# Patient Record
Sex: Female | Born: 1995 | Race: Black or African American | Hispanic: No | Marital: Single | Smoking: Current every day smoker
Health system: Southern US, Community
[De-identification: ages and names within clinical notes are randomized; demographics above are authoritative.]

## PROBLEM LIST (undated history)

## (undated) DIAGNOSIS — F329 Major depressive disorder, single episode, unspecified: Secondary | ICD-10-CM

## (undated) DIAGNOSIS — R519 Headache, unspecified: Secondary | ICD-10-CM

## (undated) DIAGNOSIS — R51 Headache: Secondary | ICD-10-CM

## (undated) DIAGNOSIS — F32A Depression, unspecified: Secondary | ICD-10-CM

---
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
## 2017-05-30 ENCOUNTER — Inpatient Hospital Stay (HOSPITAL_COMMUNITY)
Admission: AD | Admit: 2017-05-30 | Discharge: 2017-05-31 | Disposition: A | Discharge status: Home / Self Care | Payer: Self-pay | Source: Ambulatory Visit | Attending: Obstetrics and Gynecology | Admitting: Obstetrics and Gynecology

## 2017-05-30 ENCOUNTER — Encounter (HOSPITAL_COMMUNITY): Payer: Self-pay

## 2017-05-30 DIAGNOSIS — F17200 Nicotine dependence, unspecified, uncomplicated: Secondary | ICD-10-CM | POA: Insufficient documentation

## 2017-05-30 DIAGNOSIS — K2100 Gastro-esophageal reflux disease with esophagitis, without bleeding: Secondary | ICD-10-CM

## 2017-05-30 DIAGNOSIS — R51 Headache: Secondary | ICD-10-CM | POA: Insufficient documentation

## 2017-05-30 DIAGNOSIS — R079 Chest pain, unspecified: Secondary | ICD-10-CM | POA: Insufficient documentation

## 2017-05-30 DIAGNOSIS — R630 Anorexia: Secondary | ICD-10-CM | POA: Insufficient documentation

## 2017-05-30 DIAGNOSIS — R519 Headache, unspecified: Secondary | ICD-10-CM

## 2017-05-30 HISTORY — DX: Depression, unspecified: F32.A

## 2017-05-30 HISTORY — DX: Major depressive disorder, single episode, unspecified: F32.9

## 2017-05-30 HISTORY — DX: Headache, unspecified: R51.9

## 2017-05-30 HISTORY — DX: Headache: R51

## 2017-05-30 LAB — URINALYSIS, ROUTINE W REFLEX MICROSCOPIC
BILIRUBIN URINE: NEGATIVE
GLUCOSE, UA: NEGATIVE mg/dL
KETONES UR: NEGATIVE mg/dL
NITRITE: NEGATIVE
Protein, ur: 30 mg/dL — AB
Specific Gravity, Urine: 1.027 (ref 1.005–1.030)
pH: 6 (ref 5.0–8.0)

## 2017-05-30 LAB — POCT PREGNANCY, URINE: Preg Test, Ur: NEGATIVE

## 2017-05-30 MED ORDER — FAMOTIDINE 20 MG PO TABS
40.0000 mg | ORAL_TABLET | Freq: Once | ORAL | Status: AC
  Administered 2017-05-31: 40 mg via ORAL
  Filled 2017-05-30: qty 2

## 2017-05-30 MED ORDER — BUTALBITAL-APAP-CAFFEINE 50-325-40 MG PO TABS
2.0000 | ORAL_TABLET | Freq: Once | ORAL | Status: AC
  Administered 2017-05-31: 2 via ORAL
  Filled 2017-05-30: qty 2

## 2017-05-30 NOTE — MAU Note (Signed)
Pt here with c/o chest pain that started a couple of days ago, sharp pains. Denies any vaginal bleeding or discharge. LMP 04/12/17

## 2017-05-30 NOTE — MAU Provider Note (Signed)
Chief Complaint: Chest Pain   None     SUBJECTIVE HPI: Brandi Lambert is a 21 y.o. G0P0000 at Unknown by LMP who presents to maternity admissions reporting intermittent chest pain, h/a, decreased appetite x 1-2 months, worsening today. She reports she has the chest pain after smoking, and sometimes after eating. When she lies down at night she feels nauseous and like something is in her throat so she doesn't sleep much.  She denies chest pain now in MAU, it resolved spontaneously.  She reports h/a most days, constant, frontal, not associated with n/v or photophobia.  She doesn't take anything for them.  She has not tried any treatments for her symptoms. She reports lack of appetite and recent weight loss and wonders if this is causing her h/a.   She denies vaginal bleeding, vaginal itching/burning, urinary symptoms, dizziness, n/v, or fever/chills.     HPI  Past Medical History:  Diagnosis Date  . Depression   . Headache    History reviewed. No pertinent surgical history. Social History   Socioeconomic History  . Marital status: Single    Spouse name: Not on file  . Number of children: Not on file  . Years of education: Not on file  . Highest education level: Not on file  Social Needs  . Financial resource strain: Not on file  . Food insecurity - worry: Not on file  . Food insecurity - inability: Not on file  . Transportation needs - medical: Not on file  . Transportation needs - non-medical: Not on file  Occupational History  . Not on file  Tobacco Use  . Smoking status: Current Every Day Smoker  . Smokeless tobacco: Never Used  Substance and Sexual Activity  . Alcohol use: Yes    Comment: last use Dec 1   . Drug use: Yes    Types: Marijuana    Comment: last usetoday 3 dec  . Sexual activity: Yes    Birth control/protection: None    Comment: last sex 2 days ago  Other Topics Concern  . Not on file  Social History Narrative  . Not on file   No current  facility-administered medications on file prior to encounter.    No current outpatient medications on file prior to encounter.   No Known Allergies  ROS:  Review of Systems  Constitutional: Negative for chills, fatigue and fever.  Respiratory: Negative for shortness of breath.   Cardiovascular: Negative for chest pain.  Gastrointestinal: Negative for abdominal pain, nausea and vomiting.  Genitourinary: Negative for difficulty urinating, dysuria, flank pain, pelvic pain, vaginal bleeding, vaginal discharge and vaginal pain.  Neurological: Positive for headaches. Negative for dizziness.  Psychiatric/Behavioral: Negative.      I have reviewed patient's Past Medical Hx, Surgical Hx, Family Hx, Social Hx, medications and allergies.   Physical Exam   Patient Vitals for the past 24 hrs:  BP Temp Temp src Pulse Resp SpO2 Height Weight  05/31/17 0053 120/81 - - 87 16 - - -  05/30/17 2227 125/78 98.7 F (37.1 C) Oral 85 18 100 % 6' (1.829 m) 147 lb (66.7 kg)   Constitutional: Well-developed, well-nourished female in no acute distress.  Cardiovascular: normal rate Respiratory: normal effort GI: Abd soft, non-tender. Pos BS x 4 MS: Extremities nontender, no edema, normal ROM Neurologic: Alert and oriented x 4.  GU: Neg CVAT.  PELVIC EXAM: Deferred   LAB RESULTS Results for orders placed or performed during the hospital encounter of 05/30/17 (from  the past 24 hour(s))  Urinalysis, Routine w reflex microscopic     Status: Abnormal   Collection Time: 05/30/17 10:25 PM  Result Value Ref Range   Color, Urine YELLOW YELLOW   APPearance HAZY (A) CLEAR   Specific Gravity, Urine 1.027 1.005 - 1.030   pH 6.0 5.0 - 8.0   Glucose, UA NEGATIVE NEGATIVE mg/dL   Hgb urine dipstick SMALL (A) NEGATIVE   Bilirubin Urine NEGATIVE NEGATIVE   Ketones, ur NEGATIVE NEGATIVE mg/dL   Protein, ur 30 (A) NEGATIVE mg/dL   Nitrite NEGATIVE NEGATIVE   Leukocytes, UA TRACE (A) NEGATIVE   RBC / HPF 6-30 0  - 5 RBC/hpf   WBC, UA 0-5 0 - 5 WBC/hpf   Bacteria, UA RARE (A) NONE SEEN   Squamous Epithelial / LPF 6-30 (A) NONE SEEN   Mucus PRESENT   Pregnancy, urine POC     Status: None   Collection Time: 05/30/17 10:40 PM  Result Value Ref Range   Preg Test, Ur NEGATIVE NEGATIVE  CBC     Status: None   Collection Time: 05/30/17 11:59 PM  Result Value Ref Range   WBC 9.9 4.0 - 10.5 K/uL   RBC 4.22 3.87 - 5.11 MIL/uL   Hemoglobin 13.3 12.0 - 15.0 g/dL   HCT 69.6 29.5 - 28.4 %   MCV 97.4 78.0 - 100.0 fL   MCH 31.5 26.0 - 34.0 pg   MCHC 32.4 30.0 - 36.0 g/dL   RDW 13.2 44.0 - 10.2 %   Platelets 249 150 - 400 K/uL  Comprehensive metabolic panel     Status: Abnormal   Collection Time: 05/30/17 11:59 PM  Result Value Ref Range   Sodium 138 135 - 145 mmol/L   Potassium 3.9 3.5 - 5.1 mmol/L   Chloride 104 101 - 111 mmol/L   CO2 28 22 - 32 mmol/L   Glucose, Bld 94 65 - 99 mg/dL   BUN 11 6 - 20 mg/dL   Creatinine, Ser 7.25 0.44 - 1.00 mg/dL   Calcium 9.4 8.9 - 36.6 mg/dL   Total Protein 8.0 6.5 - 8.1 g/dL   Albumin 4.4 3.5 - 5.0 g/dL   AST 17 15 - 41 U/L   ALT 10 (L) 14 - 54 U/L   Alkaline Phosphatase 69 38 - 126 U/L   Total Bilirubin 0.5 0.3 - 1.2 mg/dL   GFR calc non Af Amer >60 >60 mL/min   GFR calc Af Amer >60 >60 mL/min   Anion gap 6 5 - 15  TSH     Status: None   Collection Time: 05/30/17 11:59 PM  Result Value Ref Range   TSH 1.644 0.350 - 4.500 uIU/mL       IMAGING No results found.  MAU Management/MDM: Ordered labs and reviewed results.  CBC, CMP, TSH, UA, all wnl. Chest symptoms c/w GERD. Will treat h/a tonight with Fioricet x 2 tabs and Pepcid 40 mg PO x 1.  Pt reports improved symptoms. D/C home with Rx for both.  Pt to f/u with primary care, given list of providers.Pt discharged with strict return precautions.  ASSESSMENT 1. Frontal headache   2. Decreased appetite   3. Gastroesophageal reflux disease with esophagitis     PLAN Discharge home Allergies as of  05/31/2017   No Known Allergies     Medication List    STOP taking these medications   acyclovir 400 MG tablet Commonly known as:  ZOVIRAX   fluconazole 150 MG tablet  Commonly known as:  DIFLUCAN   sulfamethoxazole-trimethoprim 800-160 MG tablet Commonly known as:  SEPTRA DS     TAKE these medications   butalbital-acetaminophen-caffeine 50-325-40 MG tablet Commonly known as:  FIORICET, ESGIC Take 1-2 tablets by mouth every 6 (six) hours as needed for headache.   ranitidine 150 MG tablet Commonly known as:  ZANTAC Take 1 tablet (150 mg total) by mouth 2 (two) times daily.      Follow-up Information    Dossie ArbourJennings, Jessica, MD Follow up.   Specialty:  Pediatrics Contact information: [address] [facility] [place] [id number] [phone]        [facility] Follow up.   Contact information: [address] [place] [id number] [phone]       [facility] Follow up.   Contact information: [address] [place] [phone]          Sharen CounterLisa Leftwich-Kirby Certified Nurse-Midwife 05/31/2017  1:46 AM

## 2017-05-31 DIAGNOSIS — R51 Headache: Secondary | ICD-10-CM

## 2017-05-31 LAB — CBC
HEMATOCRIT: 41.1 % (ref 36.0–46.0)
Hemoglobin: 13.3 g/dL (ref 12.0–15.0)
MCH: 31.5 pg (ref 26.0–34.0)
MCHC: 32.4 g/dL (ref 30.0–36.0)
MCV: 97.4 fL (ref 78.0–100.0)
Platelets: 249 10*3/uL (ref 150–400)
RBC: 4.22 MIL/uL (ref 3.87–5.11)
RDW: 13.8 % (ref 11.5–15.5)
WBC: 9.9 10*3/uL (ref 4.0–10.5)

## 2017-05-31 LAB — COMPREHENSIVE METABOLIC PANEL
ALK PHOS: 69 U/L (ref 38–126)
ALT: 10 U/L — ABNORMAL LOW (ref 14–54)
AST: 17 U/L (ref 15–41)
Albumin: 4.4 g/dL (ref 3.5–5.0)
Anion gap: 6 (ref 5–15)
BILIRUBIN TOTAL: 0.5 mg/dL (ref 0.3–1.2)
BUN: 11 mg/dL (ref 6–20)
CALCIUM: 9.4 mg/dL (ref 8.9–10.3)
CO2: 28 mmol/L (ref 22–32)
Chloride: 104 mmol/L (ref 101–111)
Creatinine, Ser: 0.83 mg/dL (ref 0.44–1.00)
GFR calc non Af Amer: >60 mL/min (ref >60)
Glucose, Bld: 94 mg/dL (ref 65–99)
Potassium: 3.9 mmol/L (ref 3.5–5.1)
Sodium: 138 mmol/L (ref 135–145)
Total Protein: 8 g/dL (ref 6.5–8.1)

## 2017-05-31 LAB — TSH: TSH: 1.644 u[IU]/mL (ref 0.350–4.500)

## 2017-05-31 MED ORDER — RANITIDINE HCL 150 MG PO TABS: 150.0000 mg | ORAL_TABLET | Freq: Two times a day (BID) | ORAL | 3 refills | Status: DC

## 2017-05-31 MED ORDER — BUTALBITAL-APAP-CAFFEINE 50-325-40 MG PO TABS: 1.0000 | ORAL_TABLET | Freq: Four times a day (QID) | ORAL | 0 refills | Status: AC | PRN

## 2017-08-12 ENCOUNTER — Emergency Department (HOSPITAL_COMMUNITY)
Admission: EM | Admit: 2017-08-12 | Discharge: 2017-08-12 | Disposition: A | Discharge status: Home / Self Care | Payer: Self-pay | Attending: Emergency Medicine | Admitting: Emergency Medicine

## 2017-08-12 ENCOUNTER — Other Ambulatory Visit: Payer: Self-pay

## 2017-08-12 ENCOUNTER — Encounter (HOSPITAL_COMMUNITY): Payer: Self-pay | Admitting: Emergency Medicine

## 2017-08-12 DIAGNOSIS — Z79899 Other long term (current) drug therapy: Secondary | ICD-10-CM | POA: Insufficient documentation

## 2017-08-12 DIAGNOSIS — H66011 Acute suppurative otitis media with spontaneous rupture of ear drum, right ear: Secondary | ICD-10-CM | POA: Insufficient documentation

## 2017-08-12 DIAGNOSIS — F17210 Nicotine dependence, cigarettes, uncomplicated: Secondary | ICD-10-CM | POA: Insufficient documentation

## 2017-08-12 MED ORDER — AMOXICILLIN 500 MG PO CAPS: 1000.0000 mg | ORAL_CAPSULE | Freq: Two times a day (BID) | ORAL | 0 refills | Status: AC

## 2017-08-12 MED ORDER — OFLOXACIN 0.3 % OT SOLN: 5.0000 [drp] | Freq: Two times a day (BID) | OTIC | 0 refills | Status: AC

## 2017-08-12 MED ORDER — IBUPROFEN 400 MG PO TABS
600.0000 mg | ORAL_TABLET | Freq: Once | ORAL | Status: AC
  Administered 2017-08-12: 600 mg via ORAL
  Filled 2017-08-12: qty 1

## 2017-08-12 NOTE — ED Triage Notes (Signed)
Pt c/o 10/10 right ear pain and cold symptoms for the past 2 days. No fever nausea or vomiting.

## 2017-09-06 NOTE — ED Provider Notes (Signed)
[facility] Provider Note   CSN: [id number] Arrival date & time: 08/12/17  2223     History   Chief Complaint Chief Complaint  Patient presents with  . Otalgia    HPI Brandi Lambert is a 22 y.o. female.  HPI Patient is a 23 year old female who presents due to right ear pain and cold symptoms.  She states that the cold symptoms have been present for 2 days, nasal congestion is bothering her.  She then developed right ear pain that is worsening and is associated with ringing in her ear.  She does say that she tried to clean her ear due to sensation of fullness and has had some drainage from it.  She has been putting cotton swabs in there because it makes it feel better.  No fevers.  No nausea or vomiting.  No diarrhea.  Having adequate urine output.  Past Medical History:  Diagnosis Date  . Depression   . Headache     There are no active problems to display for this patient.   History reviewed. No pertinent surgical history.  OB History    Gravida Para Term Preterm AB Living   0 0 0 0 0 0   SAB TAB Ectopic Multiple Live Births   0 0 0 0 0       Home Medications    Prior to Admission medications   Medication Sig Start Date End Date Taking? Authorizing Provider  butalbital-acetaminophen-caffeine (FIORICET, ESGIC) 50-325-40 MG tablet Take 1-2 tablets by mouth every 6 (six) hours as needed for headache. 05/31/17 05/31/18  Leftwich-Kirby, Wilmer Floor, CNM  ranitidine (ZANTAC) 150 MG tablet Take 1 tablet (150 mg total) by mouth 2 (two) times daily. 05/31/17   Leftwich-Kirby, Wilmer Floor, CNM    Family History History reviewed. No pertinent family history.  Social History Social History   Tobacco Use  . Smoking status: Current Every Day Smoker    Packs/day: 0.50    Types: Cigarettes  . Smokeless tobacco: Never Used  Substance Use Topics  . Alcohol use: Yes    Comment: last use Dec 1   . Drug use: Yes    Types: Marijuana    Comment:  last usetoday 3 dec     Allergies   Patient has no known allergies.   Review of Systems Review of Systems  Constitutional: Negative for activity change and fever.  HENT: Positive for congestion, ear pain, rhinorrhea, sore throat and tinnitus. Negative for trouble swallowing.   Eyes: Negative for discharge and redness.  Respiratory: Negative for cough and wheezing.   Cardiovascular: Negative for chest pain.  Gastrointestinal: Negative for diarrhea and vomiting.  Genitourinary: Negative for decreased urine volume and dysuria.  Musculoskeletal: Negative for gait problem and neck stiffness.  Skin: Negative for rash and wound.  Neurological: Negative for seizures and syncope.  Hematological: Does not bruise/bleed easily.  All other systems reviewed and are negative.    Physical Exam Updated Vital Signs BP 138/76 (BP Location: Right Arm)   Pulse 69   Temp 99.1 F (37.3 C) (Oral)   Resp 18   Ht 6' (1.829 m)   Wt 65.8 kg (145 lb)   LMP 07/13/2017   SpO2 99%   BMI 19.67 kg/m   Physical Exam  Constitutional: She is oriented to person, place, and time. She appears well-developed and well-nourished. No distress.  HENT:  Head: Normocephalic and atraumatic.  Right Ear: There is drainage. No mastoid tenderness. Tympanic membrane  is perforated.  Left Ear: External ear normal.  Nose: Mucosal edema and rhinorrhea present. Right sinus exhibits no maxillary sinus tenderness and no frontal sinus tenderness. Left sinus exhibits no maxillary sinus tenderness and no frontal sinus tenderness.  Eyes: Conjunctivae and EOM are normal.  Neck: Normal range of motion. Neck supple.  Cardiovascular: Normal rate, regular rhythm and intact distal pulses.  Pulmonary/Chest: Effort normal. No respiratory distress.  Abdominal: Soft. She exhibits no distension.  Musculoskeletal: Normal range of motion. She exhibits no edema.  Neurological: She is alert and oriented to person, place, and time.  Skin:  Skin is warm. Capillary refill takes less than 2 seconds. No rash noted.  Psychiatric: She has a normal mood and affect.  Nursing note and vitals reviewed.    ED Treatments / Results  Labs (all labs ordered are listed, but only abnormal results are displayed) Labs Reviewed - No data to display  EKG  EKG Interpretation None       Radiology No results found.  Procedures Procedures (including critical care time)  Medications Ordered in ED Medications  ibuprofen (ADVIL,MOTRIN) tablet 600 mg (600 mg Oral Given 08/12/17 2340)     Initial Impression / Assessment and Plan / ED Course  I have reviewed the triage vital signs and the nursing notes.  Pertinent labs & imaging results that were available during my care of the patient were reviewed by me and considered in my medical decision making (see chart for details).     22 year old female with right ear pain and viral upper respiratory symptoms.  Afebrile, VSS.  In no respiratory distress.  No facial tenderness to suggest sinusitis.  Her right ear does have drainage in the canal and her TM is ruptured.  Whether this was a spontaneous rupture or if this was mechanical from her trying to clean her ear is unclear.  She states that she had ear pain prior to her cleaning it.  We will treat with p.o. antibiotics to cover for typical otitis organisms and drops to cover for pseudomonas. Encouraged OTC meds for nasal congestion and close follow up at PCP if not improving.   Final Clinical Impressions(s) / ED Diagnoses   Final diagnoses:  Acute suppurative otitis media of right ear with spontaneous rupture of tympanic membrane, recurrence not specified    ED Discharge Orders        Ordered    ofloxacin (FLOXIN) 0.3 % OTIC solution  2 times daily     08/12/17 2336    amoxicillin (AMOXIL) 500 MG capsule  2 times daily     08/12/17 2336     [facility], Brandi Chao K, MD 08/12/2017 2348    [facility], Brandi Amborn K, MD 09/06/17 [phone]

## 2018-02-20 ENCOUNTER — Ambulatory Visit (HOSPITAL_COMMUNITY)
Admission: EM | Admit: 2018-02-20 | Discharge: 2018-02-20 | Disposition: A | Discharge status: Home / Self Care | Payer: Medicaid Other | Attending: Family Medicine | Admitting: Family Medicine

## 2018-02-20 ENCOUNTER — Encounter (HOSPITAL_COMMUNITY): Payer: Self-pay | Admitting: Emergency Medicine

## 2018-02-20 DIAGNOSIS — Z3202 Encounter for pregnancy test, result negative: Secondary | ICD-10-CM | POA: Diagnosis not present

## 2018-02-20 DIAGNOSIS — R51 Headache: Secondary | ICD-10-CM

## 2018-02-20 DIAGNOSIS — R42 Dizziness and giddiness: Secondary | ICD-10-CM

## 2018-02-20 DIAGNOSIS — R519 Headache, unspecified: Secondary | ICD-10-CM

## 2018-02-20 LAB — POCT URINALYSIS DIP (DEVICE)
BILIRUBIN URINE: NEGATIVE
GLUCOSE, UA: NEGATIVE mg/dL
Ketones, ur: NEGATIVE mg/dL
LEUKOCYTES UA: NEGATIVE
Nitrite: NEGATIVE
Protein, ur: NEGATIVE mg/dL
Specific Gravity, Urine: 1.02 (ref 1.005–1.030)
UROBILINOGEN UA: 0.2 mg/dL (ref 0.0–1.0)
pH: 8 (ref 5.0–8.0)

## 2018-02-20 LAB — POCT PREGNANCY, URINE: Preg Test, Ur: NEGATIVE

## 2018-02-20 MED ORDER — KETOROLAC TROMETHAMINE 60 MG/2ML IM SOLN
INTRAMUSCULAR | Status: AC
  Filled 2018-02-20: qty 2

## 2018-02-20 MED ORDER — KETOROLAC TROMETHAMINE 30 MG/ML IJ SOLN
30.0000 mg | Freq: Once | INTRAMUSCULAR | Status: AC
  Administered 2018-02-20: 30 mg via INTRAMUSCULAR

## 2018-02-20 NOTE — ED Triage Notes (Signed)
Pt sts [facility] starting this am

## 2018-02-20 NOTE — Discharge Instructions (Signed)
It was nice meeting you!!  Your urine was negative for pregnancy or infection. We will treat your headache here with Toradol injection She can continue to use Tylenol or ibuprofen at home for headache. Follow up as needed for continued or worsening symptoms

## 2018-02-20 NOTE — ED Provider Notes (Signed)
[facility]    CSN: [id number] Arrival date & time: 02/20/18  1059     History   Chief Complaint Chief Complaint  Patient presents with  . Headache    HPI Brandi Lambert is a 22 y.o. female.   She is a healthy 22 year old female that has a past medical history of depression, headache.  Patient reports frontal headache for the past 3 days.  The headache was worse this morning.  She reports lack of sleep, nausea, loss of appetite.  She reports she has to get up at 5:00 and she works for 11-hour shifts.  She denies any vomiting.  Reports sometimes when she stands up she feels little dizzy sometimes.  Denies any vision changes or blurred vision.  She denies any photophobia or phonophobia.  Has any fever, chills, neck pain.  Patient has not had a period in 3 months.  She is currently sexually active without protection. Denies any dysuria, hematuria, vaginal discharge or bleeding.   ROS per HPI     Past Medical History:  Diagnosis Date  . Depression   . Headache     There are no active problems to display for this patient.   History reviewed. No pertinent surgical history.  OB History    Gravida  0   Para  0   Term  0   Preterm  0   AB  0   Living  0     SAB  0   TAB  0   Ectopic  0   Multiple  0   Live Births  0            Home Medications    Prior to Admission medications   Medication Sig Start Date End Date Taking? Authorizing Provider  butalbital-acetaminophen-caffeine (FIORICET, ESGIC) 50-325-40 MG tablet Take 1-2 tablets by mouth every 6 (six) hours as needed for headache. 05/31/17 05/31/18  Leftwich-Kirby, Wilmer FloorLisa A, CNM  ranitidine (ZANTAC) 150 MG tablet Take 1 tablet (150 mg total) by mouth 2 (two) times daily. 05/31/17   Leftwich-Kirby, Wilmer FloorLisa A, CNM    Family History History reviewed. No pertinent family history.  Social History Social History   Tobacco Use  . Smoking status: Current Every Day Smoker    Packs/day: 0.50   Types: Cigarettes  . Smokeless tobacco: Never Used  Substance Use Topics  . Alcohol use: Yes    Comment: last use Dec 1   . Drug use: Yes    Types: Marijuana    Comment: last usetoday 3 dec     Allergies   Patient has no known allergies.   Review of Systems Review of Systems   Physical Exam Triage Vital Signs ED Triage Vitals [02/20/18 1114]  Enc Vitals Group     BP 115/68     Pulse Rate 78     Resp 16     Temp 98.4 F (36.9 C)     Temp Source Oral     SpO2 100 %     Weight      Height      Head Circumference      Peak Flow      Pain Score      Pain Loc      Pain Edu?      Excl. in GC?    No data found.  Updated Vital Signs BP 115/68 (BP Location: Left Arm)   Pulse 78   Temp 98.4 F (36.9 C) (Oral)  Resp 16   SpO2 100%   Visual Acuity Right Eye Distance:   Left Eye Distance:   Bilateral Distance:    Right Eye Near:   Left Eye Near:    Bilateral Near:     Physical Exam  Constitutional: She is oriented to person, place, and time. She appears well-developed and well-nourished.  Non-toxic appearance. She does not appear ill.  Non toxic or ill appearing.   HENT:  Head: Normocephalic and atraumatic.  Neck: Normal range of motion.  Pulmonary/Chest: Effort normal.  Abdominal: Soft. Bowel sounds are normal. There is tenderness.  Mild tenderness just under umbilicus.   Musculoskeletal: Normal range of motion.  Neurological: She is alert and oriented to person, place, and time.  Skin: Skin is warm and dry.  Nursing note and vitals reviewed.    UC Treatments / Results  Labs (all labs ordered are listed, but only abnormal results are displayed) Labs Reviewed  POCT URINALYSIS DIP (DEVICE) - Abnormal; Notable for the following components:      Result Value   Hgb urine dipstick TRACE (*)    All other components within normal limits  POCT PREGNANCY, URINE    EKG None  Radiology No results found.  Procedures Procedures (including critical  care time)  Medications Ordered in UC Medications  ketorolac (TORADOL) 30 MG/ML injection 30 mg (30 mg Intramuscular Given 02/20/18 1208)    Initial Impression / Assessment and Plan / UC Course  I have reviewed the triage vital signs and the nursing notes.  Pertinent labs & imaging results that were available during my care of the patient were reviewed by me and considered in my medical decision making (see chart for details).     Patient UA negative for infection or pregnancy. Nontoxic or ill-appearing Vital signs stable We will treat her tension headache with Toradol injection.  She can continue ibuprofen or Tylenol at home as needed.  Work note given Final Clinical Impressions(s) / UC Diagnoses   Final diagnoses:  Acute nonintractable headache, unspecified headache type     Discharge Instructions     It was nice meeting you!!  Your urine was negative for pregnancy or infection. We will treat your headache here with Toradol injection She can continue to use Tylenol or ibuprofen at home for headache. Follow up as needed for continued or worsening symptoms     ED Prescriptions    None     Controlled Substance Prescriptions [place] Controlled Substance Registry consulted? Not Applicable   Janace Aris, NP 02/20/18 1234

## 2018-02-21 ENCOUNTER — Encounter (HOSPITAL_COMMUNITY): Payer: Self-pay | Admitting: Emergency Medicine

## 2018-03-13 ENCOUNTER — Ambulatory Visit (HOSPITAL_COMMUNITY)
Admission: EM | Admit: 2018-03-13 | Discharge: 2018-03-13 | Disposition: A | Discharge status: Home / Self Care | Payer: Medicaid Other | Attending: Family Medicine | Admitting: Family Medicine

## 2018-03-13 ENCOUNTER — Encounter (HOSPITAL_COMMUNITY): Payer: Self-pay

## 2018-03-13 DIAGNOSIS — F17210 Nicotine dependence, cigarettes, uncomplicated: Secondary | ICD-10-CM | POA: Insufficient documentation

## 2018-03-13 DIAGNOSIS — F329 Major depressive disorder, single episode, unspecified: Secondary | ICD-10-CM | POA: Insufficient documentation

## 2018-03-13 DIAGNOSIS — N760 Acute vaginitis: Secondary | ICD-10-CM | POA: Diagnosis not present

## 2018-03-13 DIAGNOSIS — R102 Pelvic and perineal pain: Secondary | ICD-10-CM | POA: Diagnosis present

## 2018-03-13 LAB — POCT PREGNANCY, URINE: Preg Test, Ur: NEGATIVE

## 2018-03-13 MED ORDER — FLUCONAZOLE 200 MG PO TABS: 200.0000 mg | ORAL_TABLET | Freq: Once | ORAL | 0 refills | Status: AC

## 2018-03-13 NOTE — ED Provider Notes (Signed)
[facility]    CSN: [id number] Arrival date & time: 03/13/18  1322     History   Chief Complaint Chief Complaint  Patient presents with  . Vaginal Pain    HPI Brandi [facility] is a 22 y.o. female.   [facility] presents with complaints of vaginal itching and irritation which started approximately 2-3 days ago. Tried an OTC yeast medication last night night which did not seem to help. No sores, lesions or open areas to vulva. No abdominal pain. No urinary symptoms. No fevers or back pain. States has white vaginal d/c which is thick. Sexually active with 1 partner, doesn't use condoms, denies known exposure or risk for STD. States feels similar to yeast she has had in the past. Unknown LMP as they are irregular. She is not on any birth control. No vaginal bleeding. Doesn't have a PCP or gynecologist. Per chart review hx of chlamydia and yeast in past.    ROS per HPI.      Past Medical History:  Diagnosis Date  . Depression   . Headache     There are no active problems to display for this patient.   History reviewed. No pertinent surgical history.  OB History    Gravida  0   Para  0   Term  0   Preterm  0   AB  0   Living  0     SAB  0   TAB  0   Ectopic  0   Multiple  0   Live Births  0            Home Medications    Prior to Admission medications   Medication Sig Start Date End Date Taking? Authorizing Provider  butalbital-acetaminophen-caffeine (FIORICET, ESGIC) 50-325-40 MG tablet Take 1-2 tablets by mouth every 6 (six) hours as needed for headache. 05/31/17 05/31/18  Leftwich-Kirby, Wilmer FloorLisa A, CNM  fluconazole (DIFLUCAN) 200 MG tablet Take 1 tablet (200 mg total) by mouth once for 1 dose. 03/13/18 03/13/18  Georgetta HaberBurky, Jamarcus Laduke B, NP  ranitidine (ZANTAC) 150 MG tablet Take 1 tablet (150 mg total) by mouth 2 (two) times daily. 05/31/17   Leftwich-Kirby, Wilmer FloorLisa A, CNM    Family History Family History  Family history unknown: Yes    Social  History Social History   Tobacco Use  . Smoking status: Current Every Day Smoker    Packs/day: 0.50    Types: Cigarettes  . Smokeless tobacco: Never Used  Substance Use Topics  . Alcohol use: Yes    Comment: last use Dec 1   . Drug use: Yes    Types: Marijuana    Comment: last usetoday 3 dec     Allergies   Patient has no known allergies.   Review of Systems Review of Systems   Physical Exam Triage Vital Signs ED Triage Vitals  Enc Vitals Group     BP 03/13/18 1429 (!) 146/122     Pulse Rate 03/13/18 1429 100     Resp 03/13/18 1429 20     Temp 03/13/18 1429 97.9 F (36.6 C)     Temp Source 03/13/18 1429 Oral     SpO2 03/13/18 1429 100 %     Weight --      Height --      Head Circumference --      Peak Flow --      Pain Score 03/13/18 1428 8     Pain Loc --  Pain Edu? --      Excl. in GC? --    No data found.  Updated Vital Signs BP (!) 146/122 (BP Location: Right Arm)   Pulse 100   Temp 97.9 F (36.6 C) (Oral)   Resp 20   LMP  (LMP Unknown)   SpO2 100%    Physical Exam  Constitutional: She is oriented to person, place, and time. She appears well-developed and well-nourished. No distress.  Cardiovascular: Normal rate, regular rhythm and normal heart sounds.  Pulmonary/Chest: Effort normal and breath sounds normal.  Abdominal: Soft. There is no tenderness. There is no rigidity, no rebound, no guarding and no CVA tenderness.  Genitourinary:  Genitourinary Comments: Denies sores, lesions, vaginal bleeding; no pelvic pain; gu exam deferred at this time, vaginal self swab collected, patient agreeable    Neurological: She is alert and oriented to person, place, and time.  Skin: Skin is warm and dry.     UC Treatments / Results  Labs (all labs ordered are listed, but only abnormal results are displayed) Labs Reviewed  POCT PREGNANCY, URINE  CERVICOVAGINAL ANCILLARY ONLY    EKG None  Radiology No results found.  Procedures Procedures  (including critical care time)  Medications Ordered in UC Medications - No data to display  Initial Impression / Assessment and Plan / UC Course  I have reviewed the triage vital signs and the nursing notes.  Pertinent labs & imaging results that were available during my care of the patient were reviewed by me and considered in my medical decision making (see chart for details).     Negative urine preg here today. Vaginal cytology collected and pending. Will notify of any positive findings and if any changes to treatment are needed.  1 tab diflucan provided as results pending. Encouraged establish with PCP for recheck and to start on b/c. Patient verbalized understanding and agreeable to plan.    Final Clinical Impressions(s) / UC Diagnoses   Final diagnoses:  Acute vaginitis     Discharge Instructions     We will provide treatment for yeast today as vaginal swab is pending.  Will notify you of any positive findings and if any changes to treatment are needed.  You may also monitor your results online on your MyChart.  Please withhold from intercourse for the next week. Please use condoms to prevent STD's.   Please establish with a primary care provider for birth control management.     ED Prescriptions    Medication Sig Dispense Auth. Provider   fluconazole (DIFLUCAN) 200 MG tablet Take 1 tablet (200 mg total) by mouth once for 1 dose. 1 tablet Georgetta Haber, NP     Controlled Substance Prescriptions [place] Controlled Substance Registry consulted? Not Applicable   Georgetta Haber, NP 03/13/18 1504

## 2018-03-13 NOTE — Discharge Instructions (Signed)
We will provide treatment for yeast today as vaginal swab is pending.  Will notify you of any positive findings and if any changes to treatment are needed.  You may also monitor your results online on your MyChart.  Please withhold from intercourse for the next week. Please use condoms to prevent STD's.   Please establish with a primary care provider for birth control management.

## 2018-03-13 NOTE — ED Triage Notes (Signed)
Pt presents with vaginal pain, itchiness and discharge.

## 2018-03-14 LAB — CERVICOVAGINAL ANCILLARY ONLY
Bacterial vaginitis: NEGATIVE
CANDIDA VAGINITIS: POSITIVE — AB
Chlamydia: NEGATIVE
Neisseria Gonorrhea: NEGATIVE
TRICH (WINDOWPATH): NEGATIVE

## 2018-05-02 ENCOUNTER — Other Ambulatory Visit: Payer: Self-pay

## 2018-05-02 ENCOUNTER — Encounter (HOSPITAL_COMMUNITY): Payer: Self-pay

## 2018-05-02 ENCOUNTER — Ambulatory Visit (HOSPITAL_COMMUNITY)
Admission: EM | Admit: 2018-05-02 | Discharge: 2018-05-02 | Disposition: A | Discharge status: Home / Self Care | Payer: Medicaid Other | Attending: Family Medicine | Admitting: Family Medicine

## 2018-05-02 DIAGNOSIS — J00 Acute nasopharyngitis [common cold]: Secondary | ICD-10-CM | POA: Insufficient documentation

## 2018-05-02 DIAGNOSIS — J029 Acute pharyngitis, unspecified: Secondary | ICD-10-CM | POA: Diagnosis present

## 2018-05-02 DIAGNOSIS — F17210 Nicotine dependence, cigarettes, uncomplicated: Secondary | ICD-10-CM | POA: Diagnosis not present

## 2018-05-02 DIAGNOSIS — Z79899 Other long term (current) drug therapy: Secondary | ICD-10-CM | POA: Diagnosis not present

## 2018-05-02 LAB — POCT RAPID STREP A: STREPTOCOCCUS, GROUP A SCREEN (DIRECT): NEGATIVE

## 2018-05-02 MED ORDER — IPRATROPIUM BROMIDE 0.06 % NA SOLN: 2.0000 | Freq: Four times a day (QID) | NASAL | 0 refills | Status: DC

## 2018-05-02 MED ORDER — CETIRIZINE HCL 1 MG/ML PO SOLN: 10.0000 mg | Freq: Every day | ORAL | 0 refills | Status: DC

## 2018-05-02 MED ORDER — RABIES VACCINE, PCEC IM SUSR
INTRAMUSCULAR | Status: AC
  Filled 2018-05-02: qty 1

## 2018-05-02 MED ORDER — LIDOCAINE VISCOUS HCL 2 % MT SOLN: OROMUCOSAL | 0 refills | Status: DC

## 2018-05-02 MED ORDER — IBUPROFEN 100 MG/5ML PO SUSP: 600.0000 mg | Freq: Four times a day (QID) | ORAL | 0 refills | Status: DC | PRN

## 2018-05-02 MED ORDER — FLUTICASONE PROPIONATE 50 MCG/ACT NA SUSP: 2.0000 | Freq: Every day | NASAL | 0 refills | Status: DC

## 2018-05-02 NOTE — ED Provider Notes (Signed)
[facility]    CSN: [id number] Arrival date & time: 05/02/18  1450     History   Chief Complaint Chief Complaint  Patient presents with  . Sore Throat    HPI Brandi Lambert is a 22 y.o. female.   22 year old female comes in for 2-day history of URI symptoms.  Has had cough, sore throat, rhinorrhea, nasal congestion.  Has also had intermittent headaches.  No obvious fever, chills, night sweats.  Allergy medication without relief.  Current everyday smoker.     Past Medical History:  Diagnosis Date  . Depression   . Headache     There are no active problems to display for this patient.   History reviewed. No pertinent surgical history.  OB History    Gravida  0   Para  0   Term  0   Preterm  0   AB  0   Living  0     SAB  0   TAB  0   Ectopic  0   Multiple  0   Live Births  0            Home Medications    Prior to Admission medications   Medication Sig Start Date End Date Taking? Authorizing Provider  butalbital-acetaminophen-caffeine (FIORICET, ESGIC) 50-325-40 MG tablet Take 1-2 tablets by mouth every 6 (six) hours as needed for headache. 05/31/17 05/31/18  Leftwich-Kirby, Wilmer Floor, CNM  cetirizine HCl (ZYRTEC) 1 MG/ML solution Take 10 mLs (10 mg total) by mouth daily. 05/02/18   Cathie Hoops,  V, PA-C  fluticasone (FLONASE) 50 MCG/ACT nasal spray Place 2 sprays into both nostrils daily. 05/02/18   Cathie Hoops,  V, PA-C  ibuprofen (ADVIL,MOTRIN) 100 MG/5ML suspension Take 30 mLs (600 mg total) by mouth every 6 (six) hours as needed. 05/02/18   Cathie Hoops,  V, PA-C  ipratropium (ATROVENT) 0.06 % nasal spray Place 2 sprays into both nostrils 4 (four) times daily. 05/02/18   Cathie Hoops,  V, PA-C  lidocaine (XYLOCAINE) 2 % solution 5-15 mL gurgle as needed 05/02/18   Cathie Hoops,  V, PA-C  ranitidine (ZANTAC) 150 MG tablet Take 1 tablet (150 mg total) by mouth 2 (two) times daily. 05/31/17   Leftwich-Kirby, Wilmer Floor, CNM    Family History Family History  Family history  unknown: Yes    Social History Social History   Tobacco Use  . Smoking status: Current Every Day Smoker    Packs/day: 0.50    Types: Cigarettes  . Smokeless tobacco: Never Used  Substance Use Topics  . Alcohol use: Yes    Comment: last use Dec 1   . Drug use: Yes    Types: Marijuana    Comment: last usetoday 3 dec     Allergies   Patient has no known allergies.   Review of Systems Review of Systems  Reason unable to perform ROS: See HPI as above.     Physical Exam Triage Vital Signs ED Triage Vitals  Enc Vitals Group     BP 05/02/18 1537 116/64     Pulse Rate 05/02/18 1537 64     Resp 05/02/18 1537 20     Temp 05/02/18 1537 98 F (36.7 C)     Temp Source 05/02/18 1537 Oral     SpO2 05/02/18 1537 100 %     Weight 05/02/18 1535 150 lb (68 kg)     Height --      Head Circumference --  Peak Flow --      Pain Score 05/02/18 1535 8     Pain Loc --      Pain Edu? --      Excl. in GC? --    No data found.  Updated Vital Signs BP 116/64 (BP Location: Left Arm)   Pulse 64   Temp 98 F (36.7 C) (Oral)   Resp 20   Wt 150 lb (68 kg)   SpO2 100%   BMI 20.34 kg/m   Physical Exam  Constitutional: She is oriented to person, place, and time. She appears well-developed and well-nourished.  Non-toxic appearance. She does not appear ill. No distress.  HENT:  Head: Normocephalic and atraumatic.  Right Ear: Tympanic membrane, external ear and ear canal normal. Tympanic membrane is not erythematous and not bulging.  Left Ear: Tympanic membrane, external ear and ear canal normal. Tympanic membrane is not erythematous and not bulging.  Nose: Mucosal edema and rhinorrhea present. Right sinus exhibits no maxillary sinus tenderness and no frontal sinus tenderness. Left sinus exhibits no maxillary sinus tenderness and no frontal sinus tenderness.  Mouth/Throat: Uvula is midline and mucous membranes are normal. Posterior oropharyngeal erythema present. Tonsils are 1+ on the  right. Tonsils are 1+ on the left. No tonsillar exudate.  Eyes: Pupils are equal, round, and reactive to light. Conjunctivae are normal.  Neck: Normal range of motion. Neck supple.  Cardiovascular: Normal rate, regular rhythm and normal heart sounds. Exam reveals no gallop and no friction rub.  No murmur heard. Pulmonary/Chest: Effort normal and breath sounds normal. She has no decreased breath sounds. She has no wheezes. She has no rhonchi. She has no rales.  Lymphadenopathy:    She has no cervical adenopathy.  Neurological: She is alert and oriented to person, place, and time.  Skin: Skin is warm and dry.  Psychiatric: She has a normal mood and affect. Her behavior is normal. Judgment normal.     UC Treatments / Results  Labs (all labs ordered are listed, but only abnormal results are displayed) Labs Reviewed  CULTURE, GROUP A STREP [facility])  POCT RAPID STREP A    EKG None  Radiology No results found.  Procedures Procedures (including critical care time)  Medications Ordered in UC Medications - No data to display  Initial Impression / Assessment and Plan / UC Course  I have reviewed the triage vital signs and the nursing notes.  Pertinent labs & imaging results that were available during my care of the patient were reviewed by me and considered in my medical decision making (see chart for details).    Rapid strep negative. Patient is nontoxic in appearance. Symptomatic treatment as needed. Return precautions given.   Final Clinical Impressions(s) / UC Diagnoses   Final diagnoses:  Acute nasopharyngitis    ED Prescriptions    Medication Sig Dispense Auth. Provider   ipratropium (ATROVENT) 0.06 % nasal spray Place 2 sprays into both nostrils 4 (four) times daily. 15 mL ,  V, PA-C   fluticasone (FLONASE) 50 MCG/ACT nasal spray Place 2 sprays into both nostrils daily. 1 g ,  V, PA-C   cetirizine HCl (ZYRTEC) 1 MG/ML solution Take 10 mLs (10 mg total) by mouth  daily. 236 mL ,  V, PA-C   ibuprofen (ADVIL,MOTRIN) 100 MG/5ML suspension Take 30 mLs (600 mg total) by mouth every 6 (six) hours as needed. 473 mL ,  V, PA-C   lidocaine (XYLOCAINE) 2 % solution 5-15 mL gurgle as needed  150 mL Threasa Alpha, [place] 05/02/18 1626

## 2018-05-02 NOTE — ED Triage Notes (Signed)
Pt cc sore throat x 2 days. 

## 2018-05-02 NOTE — Discharge Instructions (Signed)
Rapid strep negative. Symptoms are most likely due to viral illness/ drainage down your throat. Start lidocaine for sore throat, do not eat or drink for the next 40 mins after use as it can stunt your gag reflex. Flonase, atrovent, Zyrtec for nasal congestion/drainage. You can use over the counter nasal saline rinse such as neti pot for nasal congestion. Monitor for any worsening of symptoms, swelling of the throat, trouble breathing, trouble swallowing, leaning forward to breath, drooling, go to the emergency department for further evaluation needed. ° °For sore throat/cough try using a honey-based tea. Use 3 teaspoons of honey with juice squeezed from half lemon. Place shaved pieces of ginger into 1/2-1 cup of water and warm over stove top. Then mix the ingredients and repeat every 4 hours as needed. ° °

## 2018-05-05 LAB — CULTURE, GROUP A STREP (THRC)

## 2018-05-08 ENCOUNTER — Emergency Department (HOSPITAL_COMMUNITY)
Admission: EM | Admit: 2018-05-08 | Discharge: 2018-05-09 | Disposition: A | Discharge status: Home / Self Care | Payer: Medicaid Other | Attending: Emergency Medicine | Admitting: Emergency Medicine

## 2018-05-08 ENCOUNTER — Emergency Department (HOSPITAL_COMMUNITY): Payer: Medicaid Other

## 2018-05-08 ENCOUNTER — Encounter (HOSPITAL_COMMUNITY): Payer: Self-pay | Admitting: Emergency Medicine

## 2018-05-08 DIAGNOSIS — Z79899 Other long term (current) drug therapy: Secondary | ICD-10-CM | POA: Insufficient documentation

## 2018-05-08 DIAGNOSIS — R112 Nausea with vomiting, unspecified: Secondary | ICD-10-CM | POA: Diagnosis not present

## 2018-05-08 DIAGNOSIS — F17210 Nicotine dependence, cigarettes, uncomplicated: Secondary | ICD-10-CM | POA: Diagnosis not present

## 2018-05-08 DIAGNOSIS — R194 Change in bowel habit: Secondary | ICD-10-CM

## 2018-05-08 DIAGNOSIS — R109 Unspecified abdominal pain: Secondary | ICD-10-CM | POA: Diagnosis present

## 2018-05-08 LAB — URINALYSIS, ROUTINE W REFLEX MICROSCOPIC
BILIRUBIN URINE: NEGATIVE
Glucose, UA: NEGATIVE mg/dL
Ketones, ur: NEGATIVE mg/dL
LEUKOCYTES UA: NEGATIVE
NITRITE: NEGATIVE
PH: 7 (ref 5.0–8.0)
PROTEIN: NEGATIVE mg/dL
SPECIFIC GRAVITY, URINE: 1.017 (ref 1.005–1.030)

## 2018-05-08 LAB — COMPREHENSIVE METABOLIC PANEL
ALK PHOS: 53 U/L (ref 38–126)
ALT: 12 U/L (ref 0–44)
AST: 14 U/L — ABNORMAL LOW (ref 15–41)
Albumin: 3.4 g/dL — ABNORMAL LOW (ref 3.5–5.0)
Anion gap: 7 (ref 5–15)
BUN: 5 mg/dL — ABNORMAL LOW (ref 6–20)
CALCIUM: 8.8 mg/dL — AB (ref 8.9–10.3)
CHLORIDE: 106 mmol/L (ref 98–111)
CO2: 26 mmol/L (ref 22–32)
CREATININE: 0.81 mg/dL (ref 0.44–1.00)
GFR calc non Af Amer: >60 mL/min (ref >60)
Glucose, Bld: 86 mg/dL (ref 70–99)
Potassium: 3.7 mmol/L (ref 3.5–5.1)
Sodium: 139 mmol/L (ref 135–145)
Total Bilirubin: 0.6 mg/dL (ref 0.3–1.2)
Total Protein: 6.6 g/dL (ref 6.5–8.1)

## 2018-05-08 LAB — CBC
HCT: 37.6 % (ref 36.0–46.0)
Hemoglobin: 11.9 g/dL — ABNORMAL LOW (ref 12.0–15.0)
MCH: 30.5 pg (ref 26.0–34.0)
MCHC: 31.6 g/dL (ref 30.0–36.0)
MCV: 96.4 fL (ref 80.0–100.0)
NRBC: 0 % (ref 0.0–0.2)
PLATELETS: 259 10*3/uL (ref 150–400)
RBC: 3.9 MIL/uL (ref 3.87–5.11)
RDW: 13.1 % (ref 11.5–15.5)
WBC: 11.8 10*3/uL — ABNORMAL HIGH (ref 4.0–10.5)

## 2018-05-08 LAB — LIPASE, BLOOD: LIPASE: 29 U/L (ref 11–51)

## 2018-05-08 LAB — I-STAT BETA HCG BLOOD, ED (MC, WL, AP ONLY)

## 2018-05-08 NOTE — ED Triage Notes (Signed)
Patient reports low abdominal pain with emesis and constipation onset last week , denies fever or diarrhea .

## 2018-05-08 NOTE — ED Notes (Signed)
Pt reports abd pain, chest congestion, stuffy nose, and sore throat for the past 2 weeks. Pt states she went to the urgent care and was given some prescriptions however they are not working.

## 2018-05-08 NOTE — ED Provider Notes (Signed)
[facility] EMERGENCY DEPARTMENT Provider Note   CSN: [id number] Arrival date & time: 05/08/18  1945     History   Chief Complaint Chief Complaint  Patient presents with  . Abdominal Pain  . Emesis    HPI Brandi Lambert is a 22 y.o. female.  The history is provided by the patient and medical records.  Abdominal Pain   Associated symptoms include vomiting and constipation.  Emesis   Associated symptoms include abdominal pain.     22 y.o. F with hx of depression and headaches, presenting to the ED for abdominal pain.  Patient reports she has been having some abdominal pain/fullness for about 2 weeks now.  She was sick recently with cough/congestion, seen at urgent care and started on medications for allergies and cough.  States that has gotten somewhat better, she continues to have cough productive with thick green/yellow mucous.  Reports some intermittent vomiting, has been able to eat/drink.  No BM in several days.  No urinary symptoms, pelvic pain, or vaginal discharge.  No prior abdominal surgeries.  Past Medical History:  Diagnosis Date  . Depression   . Headache     There are no active problems to display for this patient.   History reviewed. No pertinent surgical history.   OB History    Gravida  0   Para  0   Term  0   Preterm  0   AB  0   Living  0     SAB  0   TAB  0   Ectopic  0   Multiple  0   Live Births  0            Home Medications    Prior to Admission medications   Medication Sig Start Date End Date Taking? Authorizing Provider  butalbital-acetaminophen-caffeine (FIORICET, ESGIC) 50-325-40 MG tablet Take 1-2 tablets by mouth every 6 (six) hours as needed for headache. 05/31/17 05/31/18  Leftwich-Kirby, Wilmer Floor, CNM  cetirizine HCl (ZYRTEC) 1 MG/ML solution Take 10 mLs (10 mg total) by mouth daily. 05/02/18   Cathie Hoops, Amy V, PA-C  fluticasone (FLONASE) 50 MCG/ACT nasal spray Place 2 sprays into both nostrils daily.  05/02/18   Cathie Hoops, Amy V, PA-C  ibuprofen (ADVIL,MOTRIN) 100 MG/5ML suspension Take 30 mLs (600 mg total) by mouth every 6 (six) hours as needed. 05/02/18   Cathie Hoops, Amy V, PA-C  ipratropium (ATROVENT) 0.06 % nasal spray Place 2 sprays into both nostrils 4 (four) times daily. 05/02/18   Cathie Hoops, Amy V, PA-C  lidocaine (XYLOCAINE) 2 % solution 5-15 mL gurgle as needed 05/02/18   Cathie Hoops, Amy V, PA-C  ranitidine (ZANTAC) 150 MG tablet Take 1 tablet (150 mg total) by mouth 2 (two) times daily. 05/31/17   Leftwich-Kirby, Wilmer Floor, CNM    Family History Family History  Family history unknown: Yes    Social History Social History   Tobacco Use  . Smoking status: Current Every Day Smoker    Packs/day: 0.50    Types: Cigarettes  . Smokeless tobacco: Never Used  Substance Use Topics  . Alcohol use: Yes    Comment: last use Dec 1   . Drug use: Yes    Types: Marijuana    Comment: last usetoday 3 dec     Allergies   Patient has no known allergies.   Review of Systems Review of Systems  Gastrointestinal: Positive for abdominal pain, constipation and vomiting.  All other systems reviewed and are  negative.    Physical Exam Updated Vital Signs BP 116/73   Pulse 86   Temp 98.7 F (37.1 C) (Oral)   Resp 16   Ht 6' (1.829 m)   Wt 68 kg   SpO2 98%   BMI 20.34 kg/m   Physical Exam  Constitutional: She is oriented to person, place, and time. She appears well-developed and well-nourished.  HENT:  Head: Normocephalic and atraumatic.  Mouth/Throat: Oropharynx is clear and moist.  Eyes: Pupils are equal, round, and reactive to light. Conjunctivae and EOM are normal.  Neck: Normal range of motion.  Cardiovascular: Normal rate, regular rhythm and normal heart sounds.  Pulmonary/Chest: Effort normal and breath sounds normal. She has no wheezes. She has no rhonchi.  Wet cough but no wheezes or rhonchi, lungs clear, no distress, speaking in full sentences without difficulty  Abdominal: Soft. Bowel sounds are  normal. There is no tenderness. There is no rigidity and no guarding.  No distention, normal bowel sounds  Musculoskeletal: Normal range of motion.  Neurological: She is alert and oriented to person, place, and time.  Skin: Skin is warm and dry.  Psychiatric: She has a normal mood and affect.  Nursing note and vitals reviewed.    ED Treatments / Results  Labs (all labs ordered are listed, but only abnormal results are displayed) Labs Reviewed  COMPREHENSIVE METABOLIC PANEL - Abnormal; Notable for the following components:      Result Value   BUN 5 (*)    Calcium 8.8 (*)    Albumin 3.4 (*)    AST 14 (*)    All other components within normal limits  CBC - Abnormal; Notable for the following components:   WBC 11.8 (*)    Hemoglobin 11.9 (*)    All other components within normal limits  URINALYSIS, ROUTINE W REFLEX MICROSCOPIC - Abnormal; Notable for the following components:   APPearance HAZY (*)    Hgb urine dipstick SMALL (*)    Bacteria, UA RARE (*)    All other components within normal limits  LIPASE, BLOOD  I-STAT BETA HCG BLOOD, ED ([facility], [facility], [facility] ONLY)    EKG None  Radiology Dg Abd Acute W/chest  Result Date: 05/09/2018 CLINICAL DATA:  Acute onset of lower abdominal pain and vomiting. Constipation. EXAM: DG ABDOMEN ACUTE W/ 1V CHEST COMPARISON:  None. FINDINGS: The lungs are well-aerated and clear. There is no evidence of focal opacification, pleural effusion or pneumothorax. The cardiomediastinal silhouette is within normal limits. The visualized bowel gas pattern is unremarkable. Scattered stool and air are seen within the colon; there is no evidence of small bowel dilatation to suggest obstruction. No free intra-abdominal air is identified on the provided upright view. No acute osseous abnormalities are seen; the sacroiliac joints are unremarkable in appearance. IMPRESSION: 1. Unremarkable bowel gas pattern; no free intra-abdominal air seen. Small amount of stool noted in  the colon. 2. No acute cardiopulmonary process seen. Electronically Signed   By: Roanna Raider M.D.   On: 05/09/2018 00:32    Procedures Procedures (including critical care time)  Medications Ordered in ED Medications - No data to display   Initial Impression / Assessment and Plan / ED Course  I have reviewed the triage vital signs and the nursing notes.  Pertinent labs & imaging results that were available during my care of the patient were reviewed by me and considered in my medical decision making (see chart for details).  22 year old female presenting to the ED with  abdominal pain and intermittent vomiting over the past 2 weeks.  States abdomen feels "full".  Was seen in urgent care recently for URI type symptoms.  States she has been eating and drinking well.  She has not had a bowel movement in several days.  She is afebrile and nontoxic in appearance.  She does have a wet cough but lungs are clear without any wheezes or rhonchi.  She is in no acute distress.  Abdomen is soft and benign.  Labs overall reassuring.  She denies any urinary symptoms, pelvic pain, or vaginal discharge.  Acute abdominal series was obtained, no acute cardiopulmonary process, does have small amount of retained stool but no signs of obstruction or free air.  Suspect symptoms related to mild constipation.  Will start on stool softener, encourage good oral hydration and high-fiber diet.  She will follow-up closely with her primary care doctor.  She will return here for any new or worsening symptoms.  Final Clinical Impressions(s) / ED Diagnoses   Final diagnoses:  Non-intractable vomiting with nausea, unspecified vomiting type  Change in bowel habits    ED Discharge Orders         Ordered    docusate sodium (COLACE) 100 MG capsule  Every 12 hours     05/09/18 0056           Garlon Hatchet, PA-C 05/09/18 0100    Gilda Crease, MD 05/10/18 [phone]

## 2018-05-09 MED ORDER — DOCUSATE SODIUM 100 MG PO CAPS: 100.0000 mg | ORAL_CAPSULE | Freq: Two times a day (BID) | ORAL | 0 refills | Status: DC

## 2018-05-09 NOTE — ED Notes (Signed)
Never DC by primary RN 

## 2018-05-09 NOTE — Discharge Instructions (Signed)
Take the prescribed medication as directed.  Make sure to drink water and get some fiber in your diet which should help too. Follow-up with your primary care doctor. Return to the ED for new or worsening symptoms.

## 2019-11-27 IMAGING — DX DG ABDOMEN ACUTE W/ 1V CHEST
3 series · 3 of 3 positions shown · non-contrast
Comparison: None.

CLINICAL DATA: Acute onset of lower abdominal pain and vomiting.
Constipation.

EXAM:
DG ABDOMEN ACUTE W/ 1V CHEST

[chest pa]
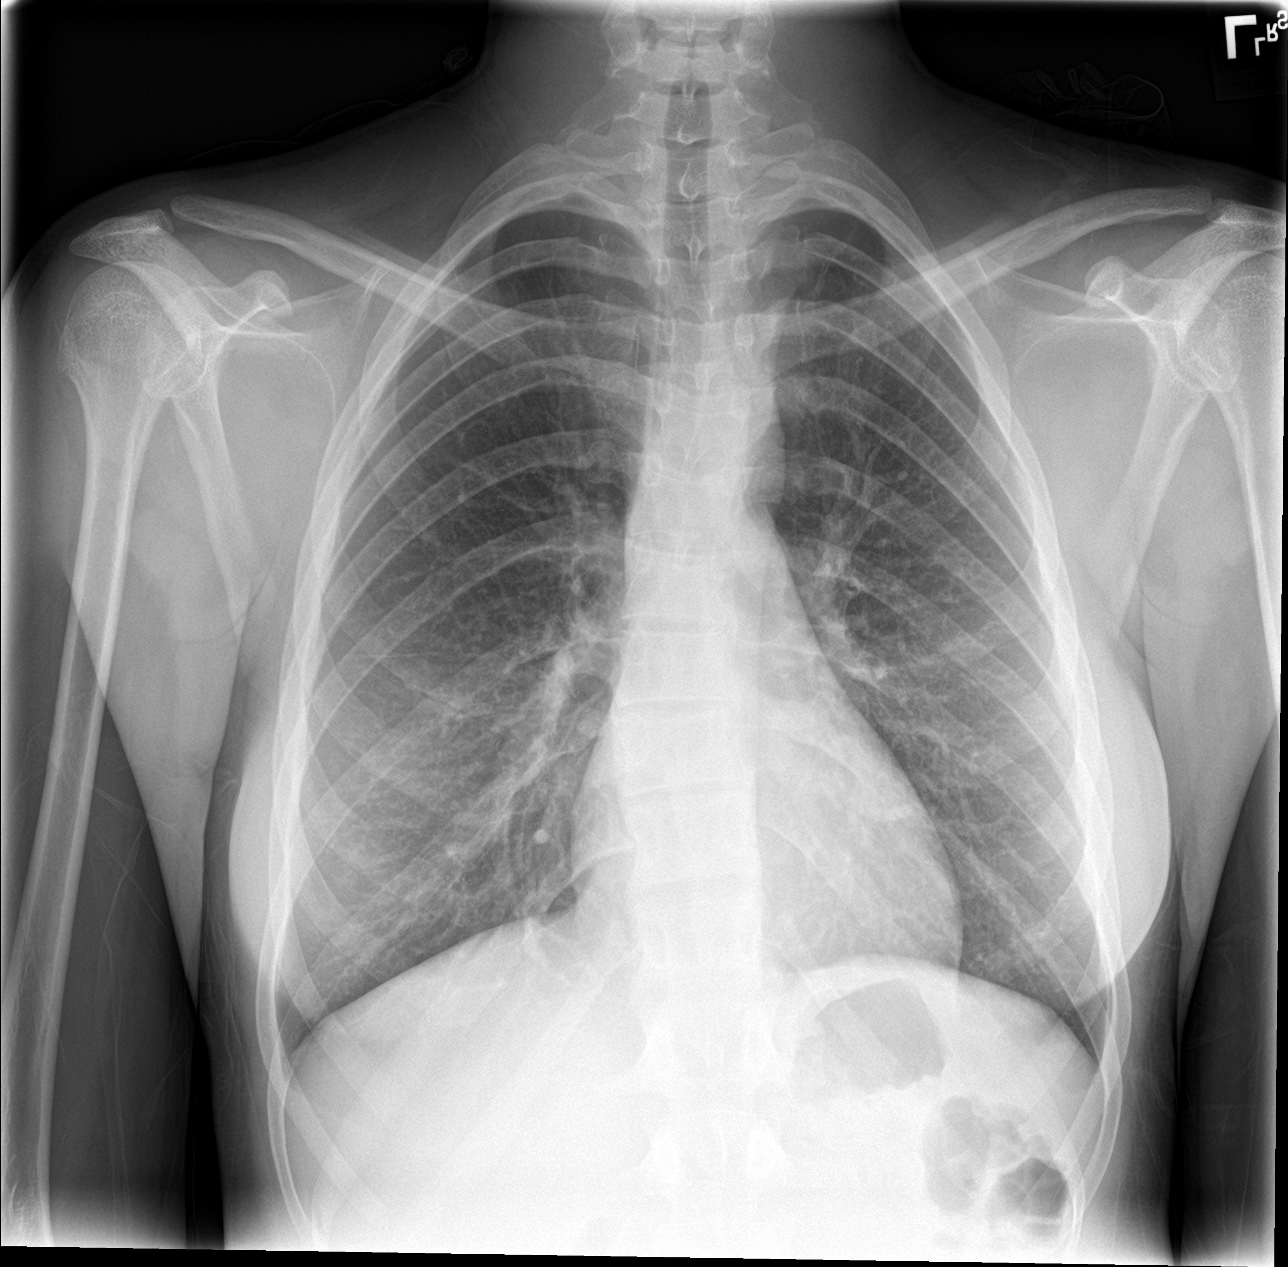

[abdomen erect]
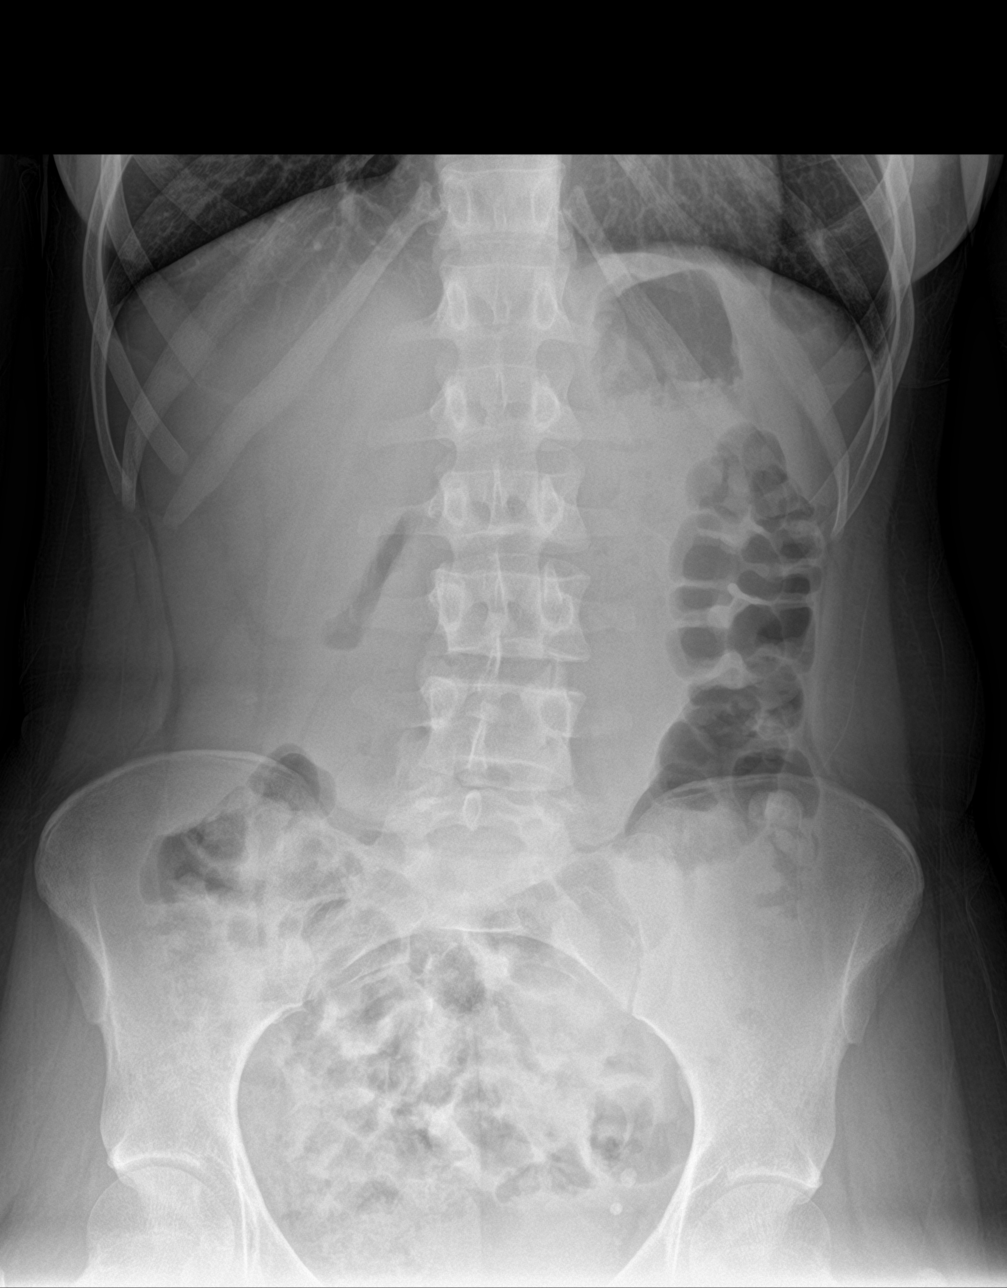

[abdomen supine]
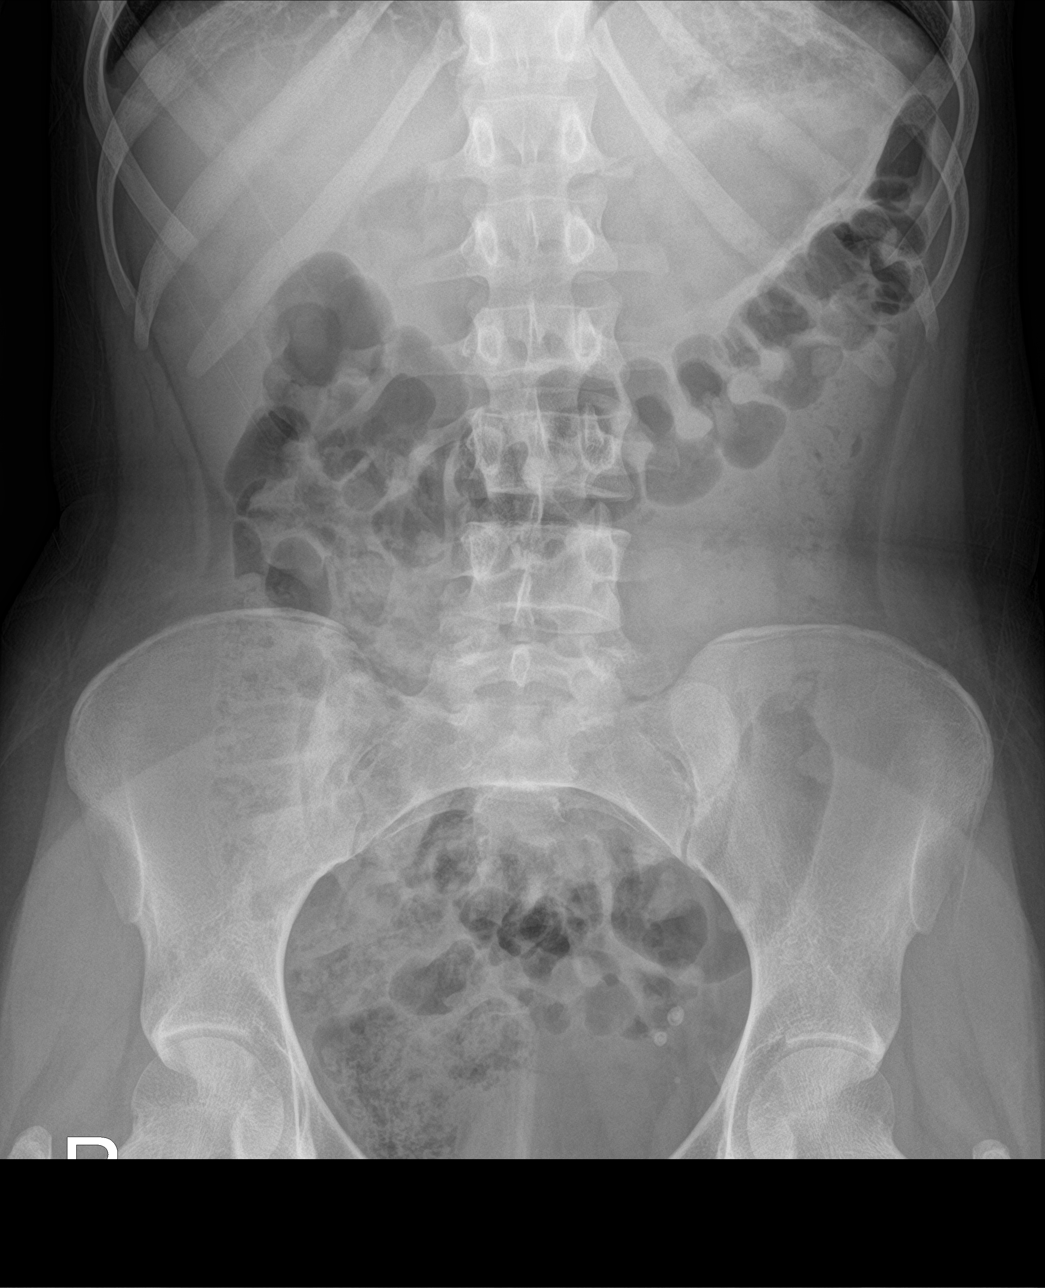

[3 of 3 positions shown; findings below may reference images not displayed]

FINDINGS: The lungs are well-aerated and clear. There is no evidence of focal
opacification, pleural effusion or pneumothorax. The
cardiomediastinal silhouette is within normal limits.

The visualized bowel gas pattern is unremarkable. Scattered stool
and air are seen within the colon; there is no evidence of small
bowel dilatation to suggest obstruction. No free intra-abdominal air
is identified on the provided upright view.

No acute osseous abnormalities are seen; the sacroiliac joints are
unremarkable in appearance.
IMPRESSION: 1. Unremarkable bowel gas pattern; no free intra-abdominal air seen.
Small amount of stool noted in the colon.
2. No acute cardiopulmonary process seen.

## 2020-07-18 ENCOUNTER — Emergency Department (HOSPITAL_COMMUNITY)
Admission: EM | Admit: 2020-07-18 | Discharge: 2020-07-19 | Disposition: A | Discharge status: Home / Self Care | Payer: Medicaid Other | Attending: Emergency Medicine | Admitting: Emergency Medicine

## 2020-07-18 DIAGNOSIS — S199XXA Unspecified injury of neck, initial encounter: Secondary | ICD-10-CM | POA: Diagnosis present

## 2020-07-18 DIAGNOSIS — Y92410 Unspecified street and highway as the place of occurrence of the external cause: Secondary | ICD-10-CM | POA: Diagnosis not present

## 2020-07-18 DIAGNOSIS — S20212A Contusion of left front wall of thorax, initial encounter: Secondary | ICD-10-CM | POA: Insufficient documentation

## 2020-07-18 DIAGNOSIS — S161XXA Strain of muscle, fascia and tendon at neck level, initial encounter: Secondary | ICD-10-CM | POA: Diagnosis not present

## 2020-07-18 DIAGNOSIS — F17210 Nicotine dependence, cigarettes, uncomplicated: Secondary | ICD-10-CM | POA: Insufficient documentation

## 2020-07-18 DIAGNOSIS — R0789 Other chest pain: Secondary | ICD-10-CM | POA: Insufficient documentation

## 2020-07-19 ENCOUNTER — Other Ambulatory Visit: Payer: Self-pay

## 2020-07-19 ENCOUNTER — Emergency Department (HOSPITAL_COMMUNITY): Payer: Medicaid Other

## 2020-07-19 ENCOUNTER — Encounter (HOSPITAL_COMMUNITY): Payer: Self-pay | Admitting: Emergency Medicine

## 2020-07-19 LAB — I-STAT BETA HCG BLOOD, ED (MC, WL, AP ONLY): I-stat hCG, quantitative: <5 m[IU]/mL (ref <5)

## 2020-07-19 MED ORDER — CYCLOBENZAPRINE HCL 10 MG PO TABS: 10.0000 mg | ORAL_TABLET | Freq: Three times a day (TID) | ORAL | 0 refills | Status: DC | PRN

## 2020-07-19 MED ORDER — IBUPROFEN 800 MG PO TABS
800.0000 mg | ORAL_TABLET | Freq: Once | ORAL | Status: AC
  Administered 2020-07-19: 800 mg via ORAL
  Filled 2020-07-19: qty 1

## 2020-07-19 MED ORDER — ACETAMINOPHEN 500 MG PO TABS
1000.0000 mg | ORAL_TABLET | Freq: Once | ORAL | Status: AC
  Administered 2020-07-19: 1000 mg via ORAL
  Filled 2020-07-19: qty 2

## 2020-07-19 MED ORDER — IBUPROFEN 800 MG PO TABS: 800.0000 mg | ORAL_TABLET | Freq: Four times a day (QID) | ORAL | 0 refills | Status: DC | PRN
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
## 2020-08-09 ENCOUNTER — Emergency Department (HOSPITAL_COMMUNITY): Payer: Medicaid Other

## 2020-08-09 ENCOUNTER — Encounter (HOSPITAL_COMMUNITY): Payer: Self-pay | Admitting: Emergency Medicine

## 2020-08-09 ENCOUNTER — Other Ambulatory Visit: Payer: Self-pay

## 2020-08-09 ENCOUNTER — Emergency Department (HOSPITAL_COMMUNITY)
Admission: EM | Admit: 2020-08-09 | Discharge: 2020-08-09 | Disposition: A | Discharge status: Home / Self Care | Payer: Medicaid Other | Source: Home / Self Care | Attending: Emergency Medicine | Admitting: Emergency Medicine

## 2020-08-09 ENCOUNTER — Emergency Department (HOSPITAL_COMMUNITY)
Admission: EM | Admit: 2020-08-09 | Discharge: 2020-08-09 | Discharge status: Against Medical Advice | Payer: Medicaid Other | Attending: Emergency Medicine | Admitting: Emergency Medicine

## 2020-08-09 DIAGNOSIS — S0993XA Unspecified injury of face, initial encounter: Secondary | ICD-10-CM | POA: Diagnosis present

## 2020-08-09 DIAGNOSIS — F17210 Nicotine dependence, cigarettes, uncomplicated: Secondary | ICD-10-CM | POA: Insufficient documentation

## 2020-08-09 DIAGNOSIS — S01511A Laceration without foreign body of lip, initial encounter: Secondary | ICD-10-CM | POA: Insufficient documentation

## 2020-08-09 DIAGNOSIS — S0081XA Abrasion of other part of head, initial encounter: Secondary | ICD-10-CM | POA: Insufficient documentation

## 2020-08-09 DIAGNOSIS — S0121XA Laceration without foreign body of nose, initial encounter: Secondary | ICD-10-CM | POA: Diagnosis not present

## 2020-08-09 DIAGNOSIS — W25XXXA Contact with sharp glass, initial encounter: Secondary | ICD-10-CM | POA: Insufficient documentation

## 2020-08-09 DIAGNOSIS — S022XXA Fracture of nasal bones, initial encounter for closed fracture: Secondary | ICD-10-CM | POA: Diagnosis not present

## 2020-08-09 MED ORDER — OXYCODONE-ACETAMINOPHEN 5-325 MG PO TABS
1.0000 | ORAL_TABLET | Freq: Once | ORAL | Status: AC
  Administered 2020-08-09: 1 via ORAL
  Filled 2020-08-09: qty 1

## 2020-08-09 MED ORDER — ONDANSETRON 4 MG PO TBDP
4.0000 mg | ORAL_TABLET | Freq: Once | ORAL | Status: AC
  Administered 2020-08-09: 4 mg via ORAL
  Filled 2020-08-09: qty 1

## 2020-08-09 MED ORDER — LIDOCAINE-EPINEPHRINE (PF) 2 %-1:200000 IJ SOLN: 20.0000 mL | Freq: Once | INTRAMUSCULAR | Status: DC

## 2020-08-09 MED ORDER — LIDOCAINE-EPINEPHRINE 1 %-1:100000 IJ SOLN
20.0000 mL | Freq: Once | INTRAMUSCULAR | Status: AC
  Administered 2020-08-09: 20 mL
  Filled 2020-08-09: qty 1

## 2020-08-09 MED ORDER — IBUPROFEN 400 MG PO TABS
600.0000 mg | ORAL_TABLET | Freq: Once | ORAL | Status: AC
  Administered 2020-08-09: 600 mg via ORAL
  Filled 2020-08-09: qty 1

## 2020-08-09 NOTE — ED Notes (Signed)
Discharge instructions discussed with pt. Pt verbalized understanding. Pt stable and ambulatory. No signature pad available. 

## 2020-08-09 NOTE — ED Notes (Signed)
Pt crying hysterically, becoming increasingly agitated with staff. Pt out of the bed, in the hall yelling at staff. [employer] present.

## 2020-08-09 NOTE — Discharge Instructions (Addendum)
Follow wound care instructions as discussed.  Please follow-up with either ear nose and throat or plastic surgery regarding your nose fracture and facial wound.  Ideally should be seen within the next week for a close wound check.  If you develop redness, drainage, fever, swelling, return to ER for reassessment.

## 2020-08-09 NOTE — ED Notes (Signed)
Lido placed at bedside.
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
## 2020-08-09 NOTE — ED Notes (Signed)
Pt continues to show increased agitation towards staff/[employer]. Pt requested/given pen & paper, proceeded to write down [employer] names & badge numbers. Pt then seen walking towards back of department stating that she was leaving. Staff & [employer] advised pt of exit, pt escorted out by [employer].

## 2020-08-09 NOTE — ED Notes (Signed)
Pt transported to CT ?
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
## 2022-09-12 ENCOUNTER — Emergency Department (HOSPITAL_COMMUNITY)
Admission: EM | Admit: 2022-09-12 | Discharge: 2022-09-12 | Disposition: A | Discharge status: Home / Self Care | Payer: Medicaid Other | Attending: Emergency Medicine | Admitting: Emergency Medicine

## 2022-09-12 DIAGNOSIS — R1010 Upper abdominal pain, unspecified: Secondary | ICD-10-CM

## 2022-09-12 DIAGNOSIS — R1013 Epigastric pain: Secondary | ICD-10-CM | POA: Insufficient documentation

## 2022-09-12 DIAGNOSIS — R112 Nausea with vomiting, unspecified: Secondary | ICD-10-CM | POA: Insufficient documentation

## 2022-09-12 LAB — URINALYSIS, ROUTINE W REFLEX MICROSCOPIC
Bacteria, UA: NONE SEEN
Bilirubin Urine: NEGATIVE
Glucose, UA: NEGATIVE mg/dL
Ketones, ur: NEGATIVE mg/dL
Leukocytes,Ua: NEGATIVE
Nitrite: NEGATIVE
Protein, ur: NEGATIVE mg/dL
Specific Gravity, Urine: 1.023 (ref 1.005–1.030)
pH: 5 (ref 5.0–8.0)

## 2022-09-12 LAB — COMPREHENSIVE METABOLIC PANEL
ALT: 16 U/L (ref 0–44)
AST: 21 U/L (ref 15–41)
Albumin: 4 g/dL (ref 3.5–5.0)
Alkaline Phosphatase: 85 U/L (ref 38–126)
Anion gap: 10 (ref 5–15)
BUN: 8 mg/dL (ref 6–20)
CO2: 21 mmol/L — ABNORMAL LOW (ref 22–32)
Calcium: 8.8 mg/dL — ABNORMAL LOW (ref 8.9–10.3)
Chloride: 104 mmol/L (ref 98–111)
Creatinine, Ser: 0.86 mg/dL (ref 0.44–1.00)
GFR, Estimated: >60 mL/min (ref >60)
Glucose, Bld: 87 mg/dL (ref 70–99)
Potassium: 3.2 mmol/L — ABNORMAL LOW (ref 3.5–5.1)
Sodium: 135 mmol/L (ref 135–145)
Total Bilirubin: 1.2 mg/dL (ref 0.3–1.2)
Total Protein: 7.8 g/dL (ref 6.5–8.1)

## 2022-09-12 LAB — RAPID URINE DRUG SCREEN, HOSP PERFORMED
Amphetamines: POSITIVE — AB
Barbiturates: NOT DETECTED
Benzodiazepines: NOT DETECTED
Cocaine: NOT DETECTED
Opiates: POSITIVE — AB
Tetrahydrocannabinol: POSITIVE — AB

## 2022-09-12 LAB — CBC
HCT: 49.1 % — ABNORMAL HIGH (ref 36.0–46.0)
Hemoglobin: 16.4 g/dL — ABNORMAL HIGH (ref 12.0–15.0)
MCH: 30.9 pg (ref 26.0–34.0)
MCHC: 33.4 g/dL (ref 30.0–36.0)
MCV: 92.5 fL (ref 80.0–100.0)
Platelets: 249 10*3/uL (ref 150–400)
RBC: 5.31 MIL/uL — ABNORMAL HIGH (ref 3.87–5.11)
RDW: 15.6 % — ABNORMAL HIGH (ref 11.5–15.5)
WBC: 10.3 10*3/uL (ref 4.0–10.5)
nRBC: 0 % (ref 0.0–0.2)

## 2022-09-12 LAB — LIPASE, BLOOD: Lipase: 26 U/L (ref 11–51)

## 2022-09-12 LAB — PREGNANCY, URINE: Preg Test, Ur: NEGATIVE

## 2022-09-12 MED ORDER — LACTATED RINGERS IV BOLUS
1000.0000 mL | Freq: Once | INTRAVENOUS | Status: AC
  Administered 2022-09-12: 1000 mL via INTRAVENOUS

## 2022-09-12 MED ORDER — ONDANSETRON 8 MG PO TBDP: 8.0000 mg | ORAL_TABLET | Freq: Three times a day (TID) | ORAL | 0 refills | Status: AC | PRN

## 2022-09-12 MED ORDER — POTASSIUM CHLORIDE CRYS ER 20 MEQ PO TBCR
40.0000 meq | EXTENDED_RELEASE_TABLET | Freq: Once | ORAL | Status: AC
  Administered 2022-09-12: 40 meq via ORAL
  Filled 2022-09-12: qty 2

## 2022-09-12 MED ORDER — HYDROMORPHONE HCL 1 MG/ML IJ SOLN
0.5000 mg | Freq: Once | INTRAMUSCULAR | Status: AC
  Administered 2022-09-12: 0.5 mg via INTRAVENOUS
  Filled 2022-09-12: qty 1

## 2022-09-12 MED ORDER — TRAMADOL HCL 50 MG PO TABS: 50.0000 mg | ORAL_TABLET | Freq: Four times a day (QID) | ORAL | 0 refills | Status: AC | PRN

## 2022-09-12 MED ORDER — ONDANSETRON HCL 4 MG/2ML IJ SOLN
4.0000 mg | Freq: Once | INTRAMUSCULAR | Status: AC
  Administered 2022-09-12: 4 mg via INTRAVENOUS
  Filled 2022-09-12: qty 2

## 2022-09-12 NOTE — Discharge Instructions (Addendum)
It was our pleasure to provide your ER care today - we hope that you feel better.  Drink plenty of fluids/stay well hydrated. Take zofran as need for nausea.Take acetaminophen or ibuprofen as need. You may also take ultram as need for pain - no driving when taking. If acid/reflux symptoms, try nexium or prevacid as need.   Follow up closely with primary care doctor in the next 1-2 days if symptoms fail to improve/resolve.  Note that increasingly we are seeing a recurrent abdominal pain and/or vomiting syndrome called Cannabinoid Hyperemesis Syndrome - see attached info - in these cases avoiding marijuana use will prevent symptoms from recurrent (note that symptoms can persist for a few weeks if history of heavy marijuana use as it can take time to get out of system).   Return to ER right away  if worse, fevers, new symptoms, new or worsening or severe abdominal pain, persistent vomiting, chest pain, trouble breathing, or other emergency concern.  You were given pain meds in the ER - no driving for the next 6 hours.

## 2022-09-12 NOTE — ED Provider Notes (Signed)
[facility] Provider Note   CSN: [id number] Arrival date & time: 09/12/22  2104     History  Chief Complaint  Patient presents with   Abdominal Pain    Brandi Lambert is a 27 y.o. female.  Pt c/o abdominal pain, and nausea/vomiting. Symptoms in past 2-3 days, acute onset, moderate, diffuse/upper abdomen, non radiating, without specific exacerbating or alleviating factors. Emesis yesterday, not bloody or bilious. No emesis today. No back/flank pain. No dysuria, hematuria or gu c/o. No hx pud, gastritis, gallstones or pancreatitis. No prior abd surgery. Last bm a couple days ago. No pelvic pain. No vaginal discharge or bleeding, lnmp 1-2 months ago. No fever or chills. No known ill contacts or bad food ingestion.   The history is provided by the patient and medical records.  Abdominal Pain Associated symptoms: nausea and vomiting   Associated symptoms: no chest pain, no chills, no cough, no diarrhea, no dysuria, no fever, no shortness of breath, no sore throat, no vaginal bleeding and no vaginal discharge        Home Medications Prior to Admission medications   Medication Sig Start Date End Date Taking? Authorizing Provider  acetaminophen (TYLENOL) 500 MG tablet Take 500 mg by mouth every 6 (six) hours as needed for moderate pain.   Yes [provider]  cetirizine HCl (ZYRTEC) 1 MG/ML solution Take 10 mLs (10 mg total) by mouth daily. Patient not taking: Reported on 09/12/2022 05/02/18   Ok Edwards, PA-C  cyclobenzaprine (FLEXERIL) 10 MG tablet Take 1 tablet (10 mg total) by mouth 3 (three) times daily as needed for muscle spasms. Patient not taking: Reported on 09/12/2022 07/19/20   Orpah Greek, MD  docusate sodium (COLACE) 100 MG capsule Take 1 capsule (100 mg total) by mouth every 12 (twelve) hours. Patient not taking: Reported on 09/12/2022 05/09/18   Larene Pickett, PA-C  fluticasone [facility]) 50 MCG/ACT nasal  spray Place 2 sprays into both nostrils daily. Patient not taking: Reported on 09/12/2022 05/02/18   Ok Edwards, PA-C  ibuprofen (ADVIL) 800 MG tablet Take 1 tablet (800 mg total) by mouth every 6 (six) hours as needed for moderate pain. Patient not taking: Reported on 09/12/2022 07/19/20   Orpah Greek, MD  ipratropium (ATROVENT) 0.06 % nasal spray Place 2 sprays into both nostrils 4 (four) times daily. Patient not taking: Reported on 09/12/2022 05/02/18   Ok Edwards, PA-C  lidocaine (XYLOCAINE) 2 % solution 5-15 mL gurgle as needed Patient not taking: Reported on 09/12/2022 05/02/18   Ok Edwards, PA-C  ranitidine (ZANTAC) 150 MG tablet Take 1 tablet (150 mg total) by mouth 2 (two) times daily. Patient not taking: Reported on 05/08/2018 05/31/17   Elvera Maria, CNM      Allergies    Patient has no known allergies.    Review of Systems   Review of Systems  Constitutional:  Negative for chills and fever.  HENT:  Negative for sore throat.   Eyes:  Negative for redness.  Respiratory:  Negative for cough and shortness of breath.   Cardiovascular:  Negative for chest pain.  Gastrointestinal:  Positive for abdominal pain, nausea and vomiting. Negative for diarrhea.  Genitourinary:  Negative for dysuria, flank pain, pelvic pain, vaginal bleeding and vaginal discharge.  Musculoskeletal:  Negative for back pain.  Skin:  Negative for rash.  Neurological:  Negative for headaches.  Hematological:  Does not bruise/bleed easily.  Psychiatric/Behavioral:  Negative for confusion.     Physical Exam Updated Vital Signs BP 108/77 (BP Location: Left Arm)   Pulse 72   Temp 98.3 F (36.8 C) (Oral)   Resp 16   Ht 1.829 m (6')   Wt 85.3 kg   SpO2 99%   BMI 25.50 kg/m  Physical Exam Vitals and nursing note reviewed.  Constitutional:      Appearance: Normal appearance. Brandi Lambert is well-developed.  HENT:     Head: Atraumatic.     Nose: Nose normal.     Mouth/Throat:     Mouth: Mucous  membranes are moist.  Eyes:     General: No scleral icterus.    Conjunctiva/sclera: Conjunctivae normal.  Neck:     Trachea: No tracheal deviation.  Cardiovascular:     Rate and Rhythm: Normal rate and regular rhythm.     Pulses: Normal pulses.     Heart sounds: Normal heart sounds. No murmur heard.    No friction rub. No gallop.  Pulmonary:     Effort: Pulmonary effort is normal. No respiratory distress.     Breath sounds: Normal breath sounds.  Abdominal:     General: Bowel sounds are normal. There is no distension.     Palpations: Abdomen is soft. There is no mass.     Tenderness: There is abdominal tenderness. There is no guarding.     Comments: Epigastric tenderness.   Genitourinary:    Comments: No cva tenderness.  Musculoskeletal:        General: No swelling or tenderness.     Cervical back: Normal range of motion and neck supple. No rigidity. No muscular tenderness.     Right lower leg: No edema.     Left lower leg: No edema.  Skin:    General: Skin is warm and dry.     Findings: No rash.  Neurological:     Mental Status: Brandi Lambert is alert.     Comments: Alert, speech normal.   Psychiatric:        Mood and Affect: Mood normal.     ED Results / Procedures / Treatments   Labs (all labs ordered are listed, but only abnormal results are displayed) Results for orders placed or performed during the hospital encounter of 09/12/22  CBC  Result Value Ref Range   WBC 10.3 4.0 - 10.5 K/uL   RBC 5.31 (H) 3.87 - 5.11 MIL/uL   Hemoglobin 16.4 (H) 12.0 - 15.0 g/dL   HCT 49.1 (H) 36.0 - 46.0 %   MCV 92.5 80.0 - 100.0 fL   MCH 30.9 26.0 - 34.0 pg   MCHC 33.4 30.0 - 36.0 g/dL   RDW 15.6 (H) 11.5 - 15.5 %   Platelets 249 150 - 400 K/uL   nRBC 0.0 0.0 - 0.2 %  Comprehensive metabolic panel  Result Value Ref Range   Sodium 135 135 - 145 mmol/L   Potassium 3.2 (L) 3.5 - 5.1 mmol/L   Chloride 104 98 - 111 mmol/L   CO2 21 (L) 22 - 32 mmol/L   Glucose, Bld 87 70 - 99 mg/dL   BUN  8 6 - 20 mg/dL   Creatinine, Ser 0.86 0.44 - 1.00 mg/dL   Calcium 8.8 (L) 8.9 - 10.3 mg/dL   Total Protein 7.8 6.5 - 8.1 g/dL   Albumin 4.0 3.5 - 5.0 g/dL   AST 21 15 - 41 U/L   ALT 16 0 - 44 U/L   Alkaline Phosphatase 85 38 -  126 U/L   Total Bilirubin 1.2 0.3 - 1.2 mg/dL   GFR, Estimated >60 >60 mL/min   Anion gap 10 5 - 15  Lipase, blood  Result Value Ref Range   Lipase 26 11 - 51 U/L  Urinalysis, Routine w reflex microscopic -Urine, Clean Catch  Result Value Ref Range   Color, Urine YELLOW YELLOW   APPearance CLEAR CLEAR   Specific Gravity, Urine 1.023 1.005 - 1.030   pH 5.0 5.0 - 8.0   Glucose, UA NEGATIVE NEGATIVE mg/dL   Hgb urine dipstick LARGE (A) NEGATIVE   Bilirubin Urine NEGATIVE NEGATIVE   Ketones, ur NEGATIVE NEGATIVE mg/dL   Protein, ur NEGATIVE NEGATIVE mg/dL   Nitrite NEGATIVE NEGATIVE   Leukocytes,Ua NEGATIVE NEGATIVE   RBC / HPF 11-20 0 - 5 RBC/hpf   WBC, UA 0-5 0 - 5 WBC/hpf   Bacteria, UA NONE SEEN NONE SEEN   Squamous Epithelial / HPF 0-5 0 - 5 /HPF   Mucus PRESENT   Rapid urine drug screen (hospital performed)  Result Value Ref Range   Opiates POSITIVE (A) NONE DETECTED   Cocaine NONE DETECTED NONE DETECTED   Benzodiazepines NONE DETECTED NONE DETECTED   Amphetamines POSITIVE (A) NONE DETECTED   Tetrahydrocannabinol POSITIVE (A) NONE DETECTED   Barbiturates NONE DETECTED NONE DETECTED  Pregnancy, urine  Result Value Ref Range   Preg Test, Ur NEGATIVE NEGATIVE     EKG None  Radiology No results found.  Procedures Procedures    Medications Ordered in ED Medications  lactated ringers bolus 1,000 mL (has no administration in time range)  potassium chloride SA (KLOR-CON M) CR tablet 40 mEq (has no administration in time range)  lactated ringers bolus 1,000 mL (1,000 mLs Intravenous New Bag/Given 09/12/22 2138)  HYDROmorphone (DILAUDID) injection 0.5 mg (0.5 mg Intravenous Given 09/12/22 2138)  ondansetron (ZOFRAN) injection 4 mg (4 mg  Intravenous Given 09/12/22 2137)    ED Course/ Medical Decision Making/ A&P                             Medical Decision Making Problems Addressed: Nausea and vomiting in adult: acute illness or injury with systemic symptoms that poses a threat to life or bodily functions Upper abdominal pain: acute illness or injury with systemic symptoms that poses a threat to life or bodily functions  Amount and/or Complexity of Data Reviewed External Data Reviewed: notes. Labs: ordered. Decision-making details documented in ED Course.  Risk Prescription drug management. Parenteral controlled substances. Decision regarding hospitalization.   Iv ns. Continuous pulse ox and cardiac monitoring. Labs ordered/sent.  Differential diagnosis includes  . Dispo decision including potential need for admission considered - will get labs  and reassess.   Reviewed nursing notes and prior charts for additional history. External reports reviewed.   Cardiac monitor: sinus rhythm, rate 72.  LR bolus iv. Dilaudid iv. Zofran iv.   Labs reviewed/interpreted by me - wbc normal. Preg neg. Ua neg for uti. Lipase normal. THC+.    Recheck abd soft non tender. No recurrent emesis. Vitals normal. ?possible chs as cause of earlier symptoms.   Pt currently appears stable for d/c.   Return precautions provided.             Final Clinical Impression(s) / ED Diagnoses Final diagnoses:  None    Rx / DC Orders ED Discharge Orders     None         Aliayah Tyer,  Lennette Bihari, MD 09/12/22 2246

## 2022-09-12 NOTE — ED Triage Notes (Signed)
Pt with reported severe abdominal pain x 3-4 days.  States it wakes her from sleep.  N/V/D on yesterday  States tried to use the bathroom today to have a bowel movement but her stomach was hurting too much.

## 2023-05-01 ENCOUNTER — Emergency Department (HOSPITAL_COMMUNITY): Payer: Self-pay

## 2023-05-01 ENCOUNTER — Emergency Department (HOSPITAL_COMMUNITY)
Admission: EM | Admit: 2023-05-01 | Discharge: 2023-05-01 | Disposition: A | Discharge status: Home / Self Care | Payer: Self-pay | Attending: Emergency Medicine | Admitting: Emergency Medicine

## 2023-05-01 ENCOUNTER — Encounter (HOSPITAL_COMMUNITY): Payer: Self-pay

## 2023-05-01 DIAGNOSIS — E876 Hypokalemia: Secondary | ICD-10-CM | POA: Insufficient documentation

## 2023-05-01 DIAGNOSIS — K529 Noninfective gastroenteritis and colitis, unspecified: Secondary | ICD-10-CM | POA: Insufficient documentation

## 2023-05-01 LAB — CBC
HCT: 49.3 % — ABNORMAL HIGH (ref 36.0–46.0)
Hemoglobin: 16.3 g/dL — ABNORMAL HIGH (ref 12.0–15.0)
MCH: 30.9 pg (ref 26.0–34.0)
MCHC: 33.1 g/dL (ref 30.0–36.0)
MCV: 93.5 fL (ref 80.0–100.0)
Platelets: 256 10*3/uL (ref 150–400)
RBC: 5.27 MIL/uL — ABNORMAL HIGH (ref 3.87–5.11)
RDW: 17 % — ABNORMAL HIGH (ref 11.5–15.5)
WBC: 6.9 10*3/uL (ref 4.0–10.5)
nRBC: 0 % (ref 0.0–0.2)

## 2023-05-01 LAB — COMPREHENSIVE METABOLIC PANEL
ALT: 35 U/L (ref 0–44)
AST: 37 U/L (ref 15–41)
Albumin: 4.2 g/dL (ref 3.5–5.0)
Alkaline Phosphatase: 94 U/L (ref 38–126)
Anion gap: 11 (ref 5–15)
BUN: <5 mg/dL — ABNORMAL LOW (ref 6–20)
CO2: 26 mmol/L (ref 22–32)
Calcium: 9.2 mg/dL (ref 8.9–10.3)
Chloride: 99 mmol/L (ref 98–111)
Creatinine, Ser: 0.77 mg/dL (ref 0.44–1.00)
GFR, Estimated: >60 mL/min (ref >60)
Glucose, Bld: 95 mg/dL (ref 70–99)
Potassium: 3 mmol/L — ABNORMAL LOW (ref 3.5–5.1)
Sodium: 136 mmol/L (ref 135–145)
Total Bilirubin: 1.2 mg/dL (ref 0.3–1.2)
Total Protein: 8.3 g/dL — ABNORMAL HIGH (ref 6.5–8.1)

## 2023-05-01 LAB — URINALYSIS, ROUTINE W REFLEX MICROSCOPIC
Bacteria, UA: NONE SEEN
Glucose, UA: NEGATIVE mg/dL
Ketones, ur: 5 mg/dL — AB
Nitrite: NEGATIVE
Protein, ur: 30 mg/dL — AB
RBC / HPF: >50 RBC/hpf (ref 0–5)
Specific Gravity, Urine: 1.029 (ref 1.005–1.030)
pH: 6 (ref 5.0–8.0)

## 2023-05-01 LAB — LIPASE, BLOOD: Lipase: 24 U/L (ref 11–51)

## 2023-05-01 LAB — HCG, SERUM, QUALITATIVE: Preg, Serum: NEGATIVE

## 2023-05-01 LAB — TROPONIN I (HIGH SENSITIVITY): Troponin I (High Sensitivity): <2 ng/L (ref <18)

## 2023-05-01 MED ORDER — LOPERAMIDE HCL 2 MG PO CAPS: 2.0000 mg | ORAL_CAPSULE | Freq: Four times a day (QID) | ORAL | 0 refills | Status: AC | PRN

## 2023-05-01 MED ORDER — ONDANSETRON 4 MG PO TBDP: 4.0000 mg | ORAL_TABLET | Freq: Three times a day (TID) | ORAL | 0 refills | Status: AC | PRN

## 2023-05-01 MED ORDER — IOHEXOL 300 MG/ML  SOLN
100.0000 mL | Freq: Once | INTRAMUSCULAR | Status: AC | PRN
  Administered 2023-05-01: 100 mL via INTRAVENOUS

## 2023-05-01 MED ORDER — POTASSIUM CHLORIDE CRYS ER 20 MEQ PO TBCR: 20.0000 meq | EXTENDED_RELEASE_TABLET | Freq: Two times a day (BID) | ORAL | 0 refills | Status: AC

## 2023-05-01 MED ORDER — DICYCLOMINE HCL 20 MG PO TABS: 20.0000 mg | ORAL_TABLET | Freq: Two times a day (BID) | ORAL | 0 refills | Status: AC

## 2023-05-01 NOTE — ED Provider Triage Note (Signed)
Emergency Medicine Provider Triage Evaluation Note  SHADDAI SHAPLEY , a 27 y.o. female  was evaluated in triage.  Pt complains of right abd pain, diarrhea. No Uti sx, no vag dc or concern for std  Review of Systems  Positive: Abd pain, diarrhea Negative: Fever, dysuria  Physical Exam  BP (!) 122/90   Pulse 90   Temp 98.7 F (37.1 C) (Oral)   Resp 15   Ht 6' (1.829 m)   Wt 85.3 kg   SpO2 98%   BMI 25.50 kg/m  Gen:   Awake, no distress   Resp:  Normal effort  ABD:  Mild tenderness to right sided abd diffusely MSK:   Moves extremities without difficulty  Other:    Medical Decision Making  Medically screening exam initiated at 6:14 PM.  Appropriate orders placed.  Anayelli R Bert was informed that the remainder of the evaluation will be completed by another provider, this initial triage assessment does not replace that evaluation, and the importance of remaining in the ED until their evaluation is complete.  Abd, diarrhea   Milani Lowenstein A, PA-C 05/01/23 1815

## 2023-05-01 NOTE — ED Provider Notes (Signed)
[place] EMERGENCY DEPARTMENT AT [facility] Provider Note   CSN: [id number] Arrival date & time: 05/01/23  1723    History  Chief Complaint  Patient presents with   Abdominal Pain   Chest Pain   Diarrhea    Brandi Lambert is a 27 y.o. female here for evaluation abdominal pain.  Generalized, worse to right side of her abdomen.  Associated diarrhea.  No fever, nausea, vomiting, shortness of breath, dysuria, hematuria.  No vaginal discharge or concern for any STDs.  Pain waxes and wanes.  No blood in stool.  No sick contacts, recent travel, antibiotics  HPI     Home Medications Prior to Admission medications   Medication Sig Start Date End Date Taking? Authorizing Provider  dicyclomine (BENTYL) 20 MG tablet Take 1 tablet (20 mg total) by mouth 2 (two) times daily. 05/01/23  Yes Arif Amendola A, PA-C  loperamide (IMODIUM) 2 MG capsule Take 1 capsule (2 mg total) by mouth 4 (four) times daily as needed for diarrhea or loose stools. 05/01/23  Yes Antar Milks A, PA-C  ondansetron (ZOFRAN-ODT) 4 MG disintegrating tablet Take 1 tablet (4 mg total) by mouth every 8 (eight) hours as needed. 05/01/23  Yes Terrill Alperin A, PA-C  potassium chloride SA (KLOR-CON M) 20 MEQ tablet Take 1 tablet (20 mEq total) by mouth 2 (two) times daily for 4 days. 05/01/23 05/05/23 Yes Icyss Skog A, PA-C  acetaminophen (TYLENOL) 500 MG tablet Take 500 mg by mouth every 6 (six) hours as needed for moderate pain.    [provider]  ondansetron (ZOFRAN-ODT) 8 MG disintegrating tablet Take 1 tablet (8 mg total) by mouth every 8 (eight) hours as needed for nausea or vomiting. 09/12/22   Cathren Laine, MD  traMADol (ULTRAM) 50 MG tablet Take 1 tablet (50 mg total) by mouth every 6 (six) hours as needed. 09/12/22   Cathren Laine, MD      Allergies    Patient has no known allergies.    Review of Systems   Review of Systems  Constitutional: Negative.   HENT: Negative.     Respiratory: Negative.    Cardiovascular: Negative.   Gastrointestinal:  Positive for abdominal pain and diarrhea. Negative for abdominal distention, anal bleeding, blood in stool, constipation, nausea, rectal pain and vomiting.  Genitourinary: Negative.   Musculoskeletal: Negative.   Skin: Negative.   Neurological: Negative.   All other systems reviewed and are negative.   Physical Exam Updated Vital Signs BP 118/84 (BP Location: Left Arm)   Pulse 80   Temp 98.5 F (36.9 C) (Oral)   Resp 18   Ht 6' (1.829 m)   Wt 85.3 kg   SpO2 100%   BMI 25.50 kg/m  Physical Exam Vitals and nursing note reviewed.  Constitutional:      General: She is not in acute distress.    Appearance: She is well-developed. She is not ill-appearing, toxic-appearing or diaphoretic.  HENT:     Head: Atraumatic.  Eyes:     Pupils: Pupils are equal, round, and reactive to light.  Cardiovascular:     Rate and Rhythm: Normal rate and regular rhythm.  Pulmonary:     Effort: Pulmonary effort is normal. No respiratory distress.     Breath sounds: Normal breath sounds.  Abdominal:     General: Bowel sounds are normal. There is no distension.     Palpations: Abdomen is soft.     Tenderness: There is generalized abdominal tenderness and  tenderness in the right upper quadrant, right lower quadrant, periumbilical area and suprapubic area. There is no right CVA tenderness, left CVA tenderness, guarding or rebound. Negative signs include Murphy's sign and McBurney's sign.  Musculoskeletal:        General: Normal range of motion.     Cervical back: Normal range of motion.  Skin:    General: Skin is warm and dry.  Neurological:     General: No focal deficit present.     Mental Status: She is alert.  Psychiatric:        Mood and Affect: Mood normal.     ED Results / Procedures / Treatments   Labs (all labs ordered are listed, but only abnormal results are displayed) Labs Reviewed  CBC - Abnormal; Notable  for the following components:      Result Value   RBC 5.27 (*)    Hemoglobin 16.3 (*)    HCT 49.3 (*)    RDW 17.0 (*)    All other components within normal limits  COMPREHENSIVE METABOLIC PANEL - Abnormal; Notable for the following components:   Potassium 3.0 (*)    BUN <5 (*)    Total Protein 8.3 (*)    All other components within normal limits  URINALYSIS, ROUTINE W REFLEX MICROSCOPIC - Abnormal; Notable for the following components:   Color, Urine AMBER (*)    APPearance HAZY (*)    Hgb urine dipstick MODERATE (*)    Bilirubin Urine SMALL (*)    Ketones, ur 5 (*)    Protein, ur 30 (*)    Leukocytes,Ua TRACE (*)    All other components within normal limits  HCG, SERUM, QUALITATIVE  LIPASE, BLOOD  TROPONIN I (HIGH SENSITIVITY)  TROPONIN I (HIGH SENSITIVITY)    EKG EKG Interpretation Date/Time:  Sunday May 01 2023 17:58:13 EST Ventricular Rate:  79 PR Interval:  144 QRS Duration:  81 QT Interval:  359 QTC Calculation: 412 R Axis:   74  Text Interpretation: Sinus rhythm Nonspecific T abnormalities, diffuse leads Inferior and anterior T-wave inversions new compared to prior EKG Jan 2022 Confirmed by Elayne Snare ([id number]) on 05/01/2023 9:29:48 PM  Radiology CT ABDOMEN PELVIS W CONTRAST  Result Date: 05/01/2023 CLINICAL DATA:  Right lower quadrant pain with diarrhea EXAM: CT ABDOMEN AND PELVIS WITH CONTRAST TECHNIQUE: Multidetector CT imaging of the abdomen and pelvis was performed using the standard protocol following bolus administration of intravenous contrast. RADIATION DOSE REDUCTION: This exam was performed according to the departmental dose-optimization program which includes automated exposure control, adjustment of the mA and/or kV according to patient size and/or use of iterative reconstruction technique. CONTRAST:  OMNIPAQUE IOHEXOL 300 MG/ML  SOLN COMPARISON:  None Available. FINDINGS: Lower chest: Lung bases are clear Hepatobiliary: Hepatic steatosis.  Focal fat infiltration near the falciform ligament. No calcified gallstone or biliary dilatation Pancreas: Unremarkable. No pancreatic ductal dilatation or surrounding inflammatory changes. Spleen: Normal in size without focal abnormality. Adrenals/Urinary Tract: Adrenal glands are unremarkable. Kidneys are normal, without renal calculi, focal lesion, or hydronephrosis. Bladder is unremarkable. Stomach/Bowel: Stomach nonenlarged. No dilated small bowel. Mild wall thickening involving the ascending colon, hepatic flexure, transverse colon, splenic flexure and descending colon with mild pericolonic fat stranding, findings would be consistent with a colitis. Deve appendix Vascular/Lymphatic: No significant vascular findings are present. No enlarged abdominal or pelvic lymph nodes. Reproductive: Uterus and bilateral adnexa are unremarkable. Other: No abdominal wall hernia or abnormality. No abdominopelvic ascites. Musculoskeletal: No acute or significant osseous  findings. IMPRESSION: 1. Findings consistent with a colitis of infectious or inflammatory etiology extending from ascending colon through the descending colon. 2. Hepatic steatosis. Electronically Signed   By: Jasmine Pang M.D.   On: 05/01/2023 21:43   DG Chest 2 View  Result Date: 05/01/2023 CLINICAL DATA:  Chest pain EXAM: CHEST - 2 VIEW COMPARISON:  07/19/2020 FINDINGS: The heart size and mediastinal contours are within normal limits. Both lungs are clear. The visualized skeletal structures are unremarkable. IMPRESSION: No active cardiopulmonary disease. Electronically Signed   By: Jasmine Pang M.D.   On: 05/01/2023 20:50    Procedures Procedures    Medications Ordered in ED Medications  iohexol (OMNIPAQUE) 300 MG/ML solution 100 mL (100 mLs Intravenous Contrast Given 05/01/23 2129)   ED Course/ Medical Decision Making/ A&P   27 year old here for evaluation abdominal pain and loose stool.  No bloody stool.  Pain more so to the right side of  her abdomen.  Recent travel, antibiotics, sick contacts.  Her abdomen is diffusely tender however no rebound or guarding.  Negative Murphy sign, guarding point.  She denies any pelvic pain, vaginal discharge, UTI symptoms, URI symptoms.  Will plan on labs imaging and reassess  Labs and imaging personally viewed and interpreted:  CBC without leukocytosis Metabolic panel hypokalemia 3.0, will supplement outpatient UA negative for infection Troponin less than 2-- pending delta Lipase 24 Preg neg CT AP with colitis EKG without ischemic changes, does have some T wave inversions Chest x-ray that significant abnormality  Patient reassessed.  She apparently had told triage she has some chest pain however denies with myself.  At this time I will suspicion for acute ACS, PE, dissection, myocarditis, pericarditis.  Initial troponin negative.  Symptoms started yesterday.  Follow-up outpatient, return for any worsening symptoms  The patient has been appropriately medically screened and/or stabilized in the ED. I have low suspicion for any other emergent medical condition which would require further screening, evaluation or treatment in the ED or require inpatient management.  Patient is hemodynamically stable and in no acute distress.  Patient able to ambulate in department prior to ED.  Evaluation does not show acute pathology that would require ongoing or additional emergent interventions while in the emergency department or further inpatient treatment.  I have discussed the diagnosis with the patient and answered all questions.  Pain is been managed while in the emergency department and patient has no further complaints prior to discharge.  Patient is comfortable with plan discussed in room and is stable for discharge at this time.  I have discussed strict return precautions for returning to the emergency department.  Patient was encouraged to follow-up with PCP/specialist refer to at discharge.                                  Medical Decision Making Amount and/or Complexity of Data Reviewed Independent Historian: friend External Data Reviewed: labs, radiology, ECG and notes. Labs: ordered. Decision-making details documented in ED Course. Radiology: ordered and independent interpretation performed. Decision-making details documented in ED Course. ECG/medicine tests: ordered and independent interpretation performed. Decision-making details documented in ED Course.  Risk OTC drugs. Prescription drug management. Parenteral controlled substances. Decision regarding hospitalization. Diagnosis or treatment significantly limited by social determinants of health.         Final Clinical Impression(s) / ED Diagnoses Final diagnoses:  Colitis  Hypokalemia    Rx / DC Orders ED  Discharge Orders          Ordered    loperamide (IMODIUM) 2 MG capsule  4 times daily PRN        05/01/23 2314    dicyclomine (BENTYL) 20 MG tablet  2 times daily        05/01/23 2314    ondansetron (ZOFRAN-ODT) 4 MG disintegrating tablet  Every 8 hours PRN        05/01/23 2314    potassium chloride SA (KLOR-CON M) 20 MEQ tablet  2 times daily        05/01/23 2349              Reesa Gotschall A, PA-C 05/01/23 2355    Elayne Snare K, DO 05/02/23 [id number]

## 2023-05-01 NOTE — ED Triage Notes (Signed)
Pt c/o generalized abdominal pain starting last night, diarrhea starting this morning, and intermittent L chest pain x2 hrs.  Pain score 8/10.    Pt reports drinking Sprite made the abdominal pain worse.

## 2023-05-01 NOTE — Discharge Instructions (Signed)
It was a pleasure taking care of you here in the emergency department today  You have colitis which is inflammation of the colon on your CT scan.  This is likely caused by a viral illness such as gastroenteritis.  I am treating with a few medications to help with your symptoms  Bentyl this helps with abdominal pain and cramping Zofran is a nausea medication Imodium helps with loose stool  Make sure to follow-up outpatient, return for any worsening symptoms

## 2023-05-02 LAB — TROPONIN I (HIGH SENSITIVITY): Troponin I (High Sensitivity): 3 ng/L (ref <18)

## 2023-10-01 ENCOUNTER — Emergency Department (HOSPITAL_COMMUNITY)
Admission: EM | Admit: 2023-10-01 | Discharge: 2023-10-02 | Disposition: A | Discharge status: Home / Self Care | Payer: Self-pay | Attending: Emergency Medicine | Admitting: Emergency Medicine

## 2023-10-01 DIAGNOSIS — S01412A Laceration without foreign body of left cheek and temporomandibular area, initial encounter: Secondary | ICD-10-CM | POA: Insufficient documentation

## 2023-10-01 DIAGNOSIS — S0992XA Unspecified injury of nose, initial encounter: Secondary | ICD-10-CM | POA: Diagnosis present

## 2023-10-01 DIAGNOSIS — S0181XA Laceration without foreign body of other part of head, initial encounter: Secondary | ICD-10-CM | POA: Insufficient documentation

## 2023-10-01 DIAGNOSIS — Z23 Encounter for immunization: Secondary | ICD-10-CM | POA: Diagnosis not present

## 2023-10-01 DIAGNOSIS — S022XXA Fracture of nasal bones, initial encounter for closed fracture: Secondary | ICD-10-CM | POA: Insufficient documentation

## 2023-10-01 DIAGNOSIS — W01198A Fall on same level from slipping, tripping and stumbling with subsequent striking against other object, initial encounter: Secondary | ICD-10-CM | POA: Diagnosis not present

## 2023-10-01 DIAGNOSIS — S0990XA Unspecified injury of head, initial encounter: Secondary | ICD-10-CM

## 2023-10-01 MED ORDER — IBUPROFEN 400 MG PO TABS
400.0000 mg | ORAL_TABLET | Freq: Once | ORAL | Status: AC | PRN
  Administered 2023-10-01: 400 mg via ORAL
  Filled 2023-10-01: qty 1

## 2023-10-01 NOTE — ED Triage Notes (Signed)
 Pt arrived POV with family; Family states patient was walking down some steps and fell face forward hitting head on bricks and grass; pt has a visible gash in the front of her head and stage 2 wound under left eye; Bleeding is controlled at this time; pt is tearful and screaming that she has HA; 400mg  Ib profen given at triage; Family confirmed patient passed out for about 30 seconds at the time of fall; pt is A&O x 4 on arrival and [facility]

## 2023-10-02 ENCOUNTER — Emergency Department (HOSPITAL_COMMUNITY): Payer: Self-pay

## 2023-10-02 MED ORDER — TETANUS-DIPHTH-ACELL PERTUSSIS 5-2.5-18.5 LF-MCG/0.5 IM SUSY
0.5000 mL | PREFILLED_SYRINGE | Freq: Once | INTRAMUSCULAR | Status: AC
  Administered 2023-10-02: 0.5 mL via INTRAMUSCULAR
  Filled 2023-10-02: qty 0.5

## 2023-10-02 MED ORDER — LIDOCAINE-EPINEPHRINE (PF) 2 %-1:200000 IJ SOLN
10.0000 mL | Freq: Once | INTRAMUSCULAR | Status: AC
  Administered 2023-10-02: 10 mL via INTRADERMAL
  Filled 2023-10-02: qty 20

## 2023-10-02 MED ORDER — ACETAMINOPHEN 500 MG PO TABS
1000.0000 mg | ORAL_TABLET | Freq: Once | ORAL | Status: AC
  Administered 2023-10-02: 1000 mg via ORAL
  Filled 2023-10-02: qty 2

## 2023-10-02 MED ORDER — OXYCODONE HCL 5 MG PO TABS
5.0000 mg | ORAL_TABLET | Freq: Once | ORAL | Status: AC
  Administered 2023-10-02: 5 mg via ORAL
  Filled 2023-10-02: qty 1

## 2023-10-02 MED ORDER — LORAZEPAM 1 MG PO TABS
0.5000 mg | ORAL_TABLET | Freq: Once | ORAL | Status: AC
  Administered 2023-10-02: 0.5 mg via ORAL
  Filled 2023-10-02: qty 1

## 2023-10-02 MED ORDER — MORPHINE SULFATE (PF) 4 MG/ML IV SOLN
6.0000 mg | Freq: Once | INTRAVENOUS | Status: AC
  Administered 2023-10-02: 6 mg via INTRAMUSCULAR
  Filled 2023-10-02: qty 2

## 2023-10-02 NOTE — ED Notes (Signed)
 Patient transported to CT

## 2023-10-02 NOTE — ED Provider Notes (Signed)
 [place] EMERGENCY DEPARTMENT AT [facility] Provider Note   CSN: [id number] Arrival date & time: 10/01/23  2158     History  Chief Complaint  Patient presents with   Fall   Head Injury    Brandi Lambert is a 28 y.o. female with PMH as listed below who presents POV with family; Family states patient was walking down some steps and fell face forward hitting head on bricks and grass; pt has a visible gash in the front of her head and wound under left eye; Bleeding is controlled at this time; pt has HA; 400mg  Ibuprofen given at triage; Family confirmed patient passed out for about 30 seconds at the time of fall. Patient denies any neck or back pain. No other injuries. Unknown previous Tdap. States she doesn't want stitches.   Past Medical History:  Diagnosis Date   Depression    Headache        Home Medications Prior to Admission medications   Medication Sig Start Date End Date Taking? Authorizing Provider  acetaminophen (TYLENOL) 500 MG tablet Take 500 mg by mouth every 6 (six) hours as needed for moderate pain.    [provider]  dicyclomine (BENTYL) 20 MG tablet Take 1 tablet (20 mg total) by mouth 2 (two) times daily. 05/01/23   Henderly, Britni A, PA-C  loperamide (IMODIUM) 2 MG capsule Take 1 capsule (2 mg total) by mouth 4 (four) times daily as needed for diarrhea or loose stools. 05/01/23   Henderly, Britni A, PA-C  ondansetron (ZOFRAN-ODT) 4 MG disintegrating tablet Take 1 tablet (4 mg total) by mouth every 8 (eight) hours as needed. 05/01/23   Henderly, Britni A, PA-C  ondansetron (ZOFRAN-ODT) 8 MG disintegrating tablet Take 1 tablet (8 mg total) by mouth every 8 (eight) hours as needed for nausea or vomiting. 09/12/22   Steinl, Kevin, MD  potassium chloride SA (KLOR-CON M) 20 MEQ tablet Take 1 tablet (20 mEq total) by mouth 2 (two) times daily for 4 days. 05/01/23 05/05/23  Henderly, Britni A, PA-C  traMADol (ULTRAM) 50 MG tablet Take 1 tablet (50 mg total)  by mouth every 6 (six) hours as needed. 09/12/22   Steinl, Kevin, MD      Allergies    Patient has no known allergies.    Review of Systems   Review of Systems A 10 point review of systems was performed and is negative unless otherwise reported in HPI.  Physical Exam Updated Vital Signs BP 115/79   Pulse (!) 105   Temp 97.7 F (36.5 C) (Axillary)   Resp 18   LMP  (LMP Unknown)   SpO2 96%  Physical Exam General: Normal appearing female, lying in bed.  HEENT: PERRLA, EOMI, Sclera anicteric, MMM, trachea midline.  0.5 cm hemostatic laceration between the eyebrows.  3 cm laceration under the left eye, not involving the lower lid or the lacrimal duct.  Hemostatic.  Stable forehead, nasal bridge, midface.  Tenderness to palpation over the nasal bridge.  No nasal septal hematoma. Cardiology: RRR, no murmurs/rubs/gallops.  Resp: Normal respiratory rate and effort. CTAB, no wheezes, rhonchi, crackles.  Abd: Soft, non-tender, non-distended. No rebound tenderness or guarding.  GU: Deferred. MSK: No peripheral edema or signs of trauma. Extremities without deformity or TTP. No cyanosis or clubbing. Skin: warm, dry. No rashes or lesions. Back: No midline C T or L spine TTP defromities or stepoffs Neuro: A&Ox4, CNs II-XII grossly intact. MAEs. Sensation grossly intact.  Psych: Normal mood  and affect.   ED Results / Procedures / Treatments   Labs (all labs ordered are listed, but only abnormal results are displayed) Labs Reviewed - No data to display  EKG None  Radiology CTH: No acute intracranial abnormality.   CT max face: 1. Acute and chronic mildly displaced left-sided nasal bone fractures. 2. Mild left facial and left infraorbital soft tissue swelling.  Procedures .Laceration Repair  Date/Time: 10/11/2023 6:01 PM  Performed by: Merdis Stalling, MD Authorized by: Merdis Stalling, MD   Consent:    Consent obtained:  Verbal   Consent given by:  Patient   Risks discussed:   Infection, need for additional repair, nerve damage, poor wound healing, poor cosmetic result, pain, retained foreign body, tendon damage and vascular damage   Alternatives discussed:  No treatment Universal protocol:    Patient identity confirmed:  Verbally with patient Anesthesia:    Anesthesia method:  Local infiltration   Local anesthetic:  Lidocaine 2% WITH epi Laceration details:    Location:  Face   Face location:  Forehead   Length (cm):  0.5   Depth (mm):  5 Pre-procedure details:    Preparation:  Imaging obtained to evaluate for foreign bodies and patient was prepped and draped in usual sterile fashion Exploration:    Hemostasis achieved with:  Direct pressure   Imaging outcome: foreign body not noted     Wound exploration: wound explored through full range of motion and entire depth of wound visualized     Wound extent: areolar tissue not violated, fascia not violated, no foreign body, no signs of injury, no nerve damage, no tendon damage, no underlying fracture and no vascular damage     Contaminated: no   Treatment:    Area cleansed with:  Saline   Amount of cleaning:  Standard Skin repair:    Repair method:  Sutures   Suture size:  6-0   Suture material:  Prolene   Suture technique:  Simple interrupted   Number of sutures:  2 Approximation:    Approximation:  Close Repair type:    Repair type:  Simple Post-procedure details:    Dressing:  Open (no dressing)   Procedure completion:  Tolerated with difficulty .Laceration Repair  Date/Time: 10/11/2023 6:02 PM  Performed by: Merdis Stalling, MD Authorized by: Merdis Stalling, MD   Consent:    Consent obtained:  Verbal   Consent given by:  Patient   Risks, benefits, and alternatives were discussed: yes     Risks discussed:  Need for additional repair, nerve damage, infection, pain, poor cosmetic result, poor wound healing, vascular damage, tendon damage and retained foreign body   Alternatives discussed:  No  treatment Universal protocol:    Patient identity confirmed:  Verbally with patient Anesthesia:    Anesthesia method:  Local infiltration   Local anesthetic:  Lidocaine 2% WITH epi Laceration details:    Location:  Face   Face location:  L cheek   Length (cm):  3   Depth (mm):  5 Pre-procedure details:    Preparation:  Imaging obtained to evaluate for foreign bodies and patient was prepped and draped in usual sterile fashion Exploration:    Hemostasis achieved with:  Direct pressure   Imaging outcome: foreign body not noted     Wound exploration: wound explored through full range of motion and entire depth of wound visualized     Wound extent: areolar tissue not violated, fascia not violated, no foreign body,  no signs of injury, no nerve damage, no tendon damage, no underlying fracture and no vascular damage     Contaminated: no   Treatment:    Area cleansed with:  Saline   Amount of cleaning:  Standard Skin repair:    Repair method:  Sutures   Suture size:  6-0   Suture material:  Prolene   Suture technique:  Simple interrupted   Number of sutures:  5 Approximation:    Approximation:  Close Repair type:    Repair type:  Simple Post-procedure details:    Dressing:  Open (no dressing)   Procedure completion:  Tolerated with difficulty     Medications Ordered in ED Medications  ibuprofen (ADVIL) tablet 400 mg (400 mg Oral Given 10/01/23 2220)  LORazepam (ATIVAN) tablet 0.5 mg (0.5 mg Oral Given 10/02/23 4098)  oxyCODONE (Oxy IR/ROXICODONE) immediate release tablet 5 mg (5 mg Oral Given 10/02/23 0213)  acetaminophen (TYLENOL) tablet 1,000 mg (1,000 mg Oral Given 10/02/23 1191)  morphine (PF) 4 MG/ML injection 6 mg (6 mg Intramuscular Given 10/02/23 0247)  lidocaine-EPINEPHrine (XYLOCAINE W/EPI) 2 %-1:200000 (PF) injection 10 mL (10 mLs Intradermal Given by Other 10/02/23 0314)  Tdap (BOOSTRIX) injection 0.5 mL (0.5 mLs Intramuscular Given 10/02/23 0246)    ED Course/ Medical Decision  Making/ A&P                          Medical Decision Making Amount and/or Complexity of Data Reviewed Radiology: ordered. Decision-making details documented in ED Course.  Risk OTC drugs. Prescription drug management.    This patient presents to the ED for concern of fall, facial trauma, this involves an extensive number of treatment options, and is a complaint that carries with it a high risk of complications and morbidity.  I considered the following differential and admission for this acute, potentially life threatening condition.   MDM:    DDX for trauma includes but is not limited to:  -Head Injury such as skull fx or ICH -with head trauma and loss of consciousness, reassuringly CT it is negative for skull fracture or ICH. -Spinal Cord or Vertebral injury -she has no focal neurodeficits and no neck pain, ruled out by [place] C-spine criteria  - CT max face does show nasal bone fractures.  Nose nasal septal hematoma.  Will be given sinus precautions and instructed to follow-up with ENT.   Clinical Course as of 10/11/23 1800  Sun Oct 02, 2023  0127 CT Maxillofacial Wo Contrast 1. Acute and chronic mildly displaced left-sided nasal bone fractures. 2. Mild left facial and left infraorbital soft tissue swelling.   [HN]  0202 Patient refusing stitches. She has family at bedside. Patient requests something to help with anxiety and will reconsider stitches. I extensively discussed possibility of scarring with patient.  [HN]    Clinical Course User Index [HN] Merdis Stalling, MD   Patient ultimately agrees to stitches after pain control and anxiolysis and encouragement from family at bedside. Patient presents with laceration to face. Laceration was cleaned with copious irrigation and repaired.  No retained foreign body was found. Given proxmity of laceration to potential lacrimal duct, would was examined to fullest extent which did not demonstrate any e/o lacrimal duct  involvement.   Motor-neuro-vascular exam intact prior to procedure No evidence of tendon injury on exploration of wound. Motor-neuro-vascular exam intact after procedure  -Tdap administered  COUNSELING: Patient is advised on follow up timing for suture removal, to  be removed by healthcare professional in 5-7 days.   I discussed the possibility of residual foreign body with patient and that no matter how thorough the search it is still a possibility. I explained to return if patient notices signs of retained FB. I also explained what to look for with regards to infection. The patient agreed to return with any purulent drainage, extending erythema or pain, fever, nausea/vomiting or any other changes.  I also discussed the inevitability of scarring with the patient. They understand that all lacerations will leave a varying degree of scarring and optimal outcome/cosmetic appearance can never be guaranteed. They also understand the possibility of prompt revision by plastic surgery if desired.   Imaging Studies ordered: I ordered imaging studies including CT head, CT max face I independently visualized and interpreted imaging. I agree with the radiologist interpretation  Additional history obtained from chart review, family at bedside.    Reevaluation: After the interventions noted above, I reevaluated the patient and found that they have :improved  Social Determinants of Health:  lives independently  Disposition:  DC w/ discharge instructions/return precautions. All questions answered to patient's satisfaction.    Co morbidities that complicate the patient evaluation  Past Medical History:  Diagnosis Date   Depression    Headache      Medicines Meds ordered this encounter  Medications   ibuprofen (ADVIL) tablet 400 mg   LORazepam (ATIVAN) tablet 0.5 mg   oxyCODONE (Oxy IR/ROXICODONE) immediate release tablet 5 mg    Refill:  0   acetaminophen (TYLENOL) tablet 1,000 mg    morphine (PF) 4 MG/ML injection 6 mg   lidocaine-EPINEPHrine (XYLOCAINE W/EPI) 2 %-1:200000 (PF) injection 10 mL   Tdap (BOOSTRIX) injection 0.5 mL    I have reviewed the patients home medicines and have made adjustments as needed  Problem List / ED Course: Problem List Items Addressed This Visit   None Visit Diagnoses       Injury of head, initial encounter    -  Primary     Closed fracture of nasal bone, initial encounter         Facial laceration, initial encounter                       This note was created using dictation software, which may contain spelling or grammatical errors.    Merdis Stalling, MD 10/11/23 [phone]

## 2023-10-02 NOTE — Discharge Instructions (Addendum)
 Thank you for coming to [facility] Emergency Department. You were seen for fall and head injury. We did an exam, and imaging, and these showed a mildly displaced nasal bone fracture. You also have a large laceration to your face that you did not want stitches for. You understand that the risk of scarring significantly increases without stitches.   Please follow up with your primary care provider within 1 week.   Do not hesitate to return to the ED or call 911 if you experience: -Worsening symptoms -Lightheadedness, passing out -Fevers/chills -Anything else that concerns you

## 2023-12-21 ENCOUNTER — Encounter (HOSPITAL_COMMUNITY): Payer: Self-pay | Admitting: *Deleted

## 2023-12-21 ENCOUNTER — Other Ambulatory Visit: Payer: Self-pay

## 2023-12-21 ENCOUNTER — Emergency Department (HOSPITAL_COMMUNITY)
Admission: EM | Admit: 2023-12-21 | Discharge: 2023-12-22 | Disposition: A | Discharge status: Home / Self Care | Payer: Self-pay | Attending: Emergency Medicine | Admitting: Emergency Medicine

## 2023-12-21 DIAGNOSIS — M79641 Pain in right hand: Secondary | ICD-10-CM | POA: Diagnosis present

## 2023-12-21 DIAGNOSIS — E876 Hypokalemia: Secondary | ICD-10-CM | POA: Diagnosis not present

## 2023-12-21 DIAGNOSIS — B379 Candidiasis, unspecified: Secondary | ICD-10-CM | POA: Insufficient documentation

## 2023-12-21 DIAGNOSIS — L989 Disorder of the skin and subcutaneous tissue, unspecified: Secondary | ICD-10-CM | POA: Insufficient documentation

## 2023-12-21 DIAGNOSIS — B9689 Other specified bacterial agents as the cause of diseases classified elsewhere: Secondary | ICD-10-CM | POA: Diagnosis not present

## 2023-12-21 DIAGNOSIS — N760 Acute vaginitis: Secondary | ICD-10-CM | POA: Insufficient documentation
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
## 2023-12-22 ENCOUNTER — Emergency Department (HOSPITAL_COMMUNITY): Payer: Self-pay

## 2023-12-22 LAB — CBC WITH DIFFERENTIAL/PLATELET
Abs Immature Granulocytes: 0.1 10*3/uL — ABNORMAL HIGH (ref 0.00–0.07)
Basophils Absolute: 0 10*3/uL (ref 0.0–0.1)
Basophils Relative: 0 %
Eosinophils Absolute: 0 10*3/uL (ref 0.0–0.5)
Eosinophils Relative: 1 %
HCT: 50.6 % — ABNORMAL HIGH (ref 36.0–46.0)
Hemoglobin: 16.5 g/dL — ABNORMAL HIGH (ref 12.0–15.0)
Immature Granulocytes: 1 %
Lymphocytes Relative: 26 %
Lymphs Abs: 2.2 10*3/uL (ref 0.7–4.0)
MCH: 30.4 pg (ref 26.0–34.0)
MCHC: 32.6 g/dL (ref 30.0–36.0)
MCV: 93.4 fL (ref 80.0–100.0)
Monocytes Absolute: 0.9 10*3/uL (ref 0.1–1.0)
Monocytes Relative: 11 %
Neutro Abs: 5 10*3/uL (ref 1.7–7.7)
Neutrophils Relative %: 61 %
Platelets: 169 10*3/uL (ref 150–400)
RBC: 5.42 MIL/uL — ABNORMAL HIGH (ref 3.87–5.11)
RDW: 17.2 % — ABNORMAL HIGH (ref 11.5–15.5)
WBC: 8.3 10*3/uL (ref 4.0–10.5)
nRBC: 0 % (ref 0.0–0.2)

## 2023-12-22 LAB — WET PREP, GENITAL
Sperm: NONE SEEN
Trich, Wet Prep: NONE SEEN
WBC, Wet Prep HPF POC: <10 (ref <10)

## 2023-12-22 LAB — COMPREHENSIVE METABOLIC PANEL WITH GFR
ALT: 30 U/L (ref 0–44)
AST: 36 U/L (ref 15–41)
Albumin: 3.6 g/dL (ref 3.5–5.0)
Alkaline Phosphatase: 92 U/L (ref 38–126)
Anion gap: 11 (ref 5–15)
BUN: <5 mg/dL — ABNORMAL LOW (ref 6–20)
CO2: 23 mmol/L (ref 22–32)
Calcium: 8.5 mg/dL — ABNORMAL LOW (ref 8.9–10.3)
Chloride: 101 mmol/L (ref 98–111)
Creatinine, Ser: 0.53 mg/dL (ref 0.44–1.00)
GFR, Estimated: >60 mL/min (ref >60)
Glucose, Bld: 110 mg/dL — ABNORMAL HIGH (ref 70–99)
Potassium: 2.9 mmol/L — ABNORMAL LOW (ref 3.5–5.1)
Sodium: 135 mmol/L (ref 135–145)
Total Bilirubin: 1.3 mg/dL — ABNORMAL HIGH (ref 0.0–1.2)
Total Protein: 8.2 g/dL — ABNORMAL HIGH (ref 6.5–8.1)

## 2023-12-22 LAB — I-STAT CG4 LACTIC ACID, ED
Lactic Acid, Venous: 2 mmol/L (ref 0.5–1.9)
Lactic Acid, Venous: 2.1 mmol/L (ref 0.5–1.9)

## 2023-12-22 LAB — GC/CHLAMYDIA PROBE AMP (~~LOC~~) NOT AT ARMC
Chlamydia: NEGATIVE
Comment: NEGATIVE
Comment: NORMAL
Neisseria Gonorrhea: NEGATIVE

## 2023-12-22 LAB — HIV ANTIBODY (ROUTINE TESTING W REFLEX): HIV Screen 4th Generation wRfx: NONREACTIVE

## 2023-12-22 LAB — HCG, SERUM, QUALITATIVE: Preg, Serum: NEGATIVE

## 2023-12-22 LAB — RPR
RPR Ser Ql: REACTIVE — AB
RPR Titer: 1:32 {titer}

## 2023-12-22 MED ORDER — POTASSIUM CHLORIDE 20 MEQ PO PACK: 80.0000 meq | PACK | Freq: Every day | ORAL | Status: DC

## 2023-12-22 MED ORDER — SODIUM CHLORIDE 0.9 % IV BOLUS
1000.0000 mL | Freq: Once | INTRAVENOUS | Status: AC
  Administered 2023-12-22: 1000 mL via INTRAVENOUS

## 2023-12-22 MED ORDER — FLUCONAZOLE 150 MG PO TABS
150.0000 mg | ORAL_TABLET | Freq: Once | ORAL | Status: AC
  Administered 2023-12-22: 150 mg via ORAL
  Filled 2023-12-22: qty 1

## 2023-12-22 MED ORDER — SODIUM CHLORIDE 0.9 % IV BOLUS
1000.0000 mL | Freq: Once | INTRAVENOUS | Status: AC
Start: 2023-12-22 — End: 2023-12-22
  Administered 2023-12-22: 1000 mL via INTRAVENOUS

## 2023-12-22 MED ORDER — POTASSIUM CHLORIDE 20 MEQ PO PACK
80.0000 meq | PACK | ORAL | Status: AC
  Administered 2023-12-22: 80 meq via ORAL
  Filled 2023-12-22: qty 4

## 2023-12-22 MED ORDER — METRONIDAZOLE 500 MG PO TABS: 500.0000 mg | ORAL_TABLET | Freq: Two times a day (BID) | ORAL | 0 refills | Status: AC

## 2023-12-22 MED ORDER — PENICILLIN G BENZATHINE 1200000 UNIT/2ML IM SUSY
2.4000 10*6.[IU] | PREFILLED_SYRINGE | INTRAMUSCULAR | Status: AC
  Administered 2023-12-22: 2.4 10*6.[IU] via INTRAMUSCULAR
  Filled 2023-12-22: qty 4
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
## 2023-12-22 NOTE — Discharge Instructions (Addendum)
 It was a pleasure taking part in your care.  As discussed, please follow-up on RPR in 2 days on MyChart.  If positive, please follow-up with health department 1 week from today for second injection of 2.4 million units benzathine penicillin.  If negative, please follow-up with PCP.  Please begin taking metronidazole for the next 7 days twice daily.  Please do not drink while on this medication, you will have a very severe reaction.  Please eat foods that contain potassium as your potassium is slightly low here today.  Please read attached guide concerning potassium content of foods.  Please return to the ED with any new or worsening symptoms.

## 2023-12-22 NOTE — ED Notes (Signed)
 Patient is self swabbing at this time for tests

## 2023-12-23 LAB — T.PALLIDUM AB, TOTAL: T Pallidum Abs: REACTIVE — AB

## 2023-12-27 ENCOUNTER — Ambulatory Visit (HOSPITAL_COMMUNITY): Payer: Self-pay

## 2024-02-18 ENCOUNTER — Emergency Department (HOSPITAL_COMMUNITY)
Admission: EM | Admit: 2024-02-18 | Discharge: 2024-02-19 | Disposition: A | Discharge status: Home / Self Care | Attending: Emergency Medicine | Admitting: Emergency Medicine

## 2024-02-18 ENCOUNTER — Encounter (HOSPITAL_COMMUNITY): Payer: Self-pay | Admitting: Emergency Medicine

## 2024-02-18 ENCOUNTER — Emergency Department (HOSPITAL_COMMUNITY)

## 2024-02-18 ENCOUNTER — Other Ambulatory Visit: Payer: Self-pay

## 2024-02-18 DIAGNOSIS — K529 Noninfective gastroenteritis and colitis, unspecified: Secondary | ICD-10-CM | POA: Insufficient documentation

## 2024-02-18 DIAGNOSIS — N390 Urinary tract infection, site not specified: Secondary | ICD-10-CM | POA: Diagnosis not present

## 2024-02-18 DIAGNOSIS — E876 Hypokalemia: Secondary | ICD-10-CM | POA: Insufficient documentation

## 2024-02-18 DIAGNOSIS — R112 Nausea with vomiting, unspecified: Secondary | ICD-10-CM

## 2024-02-18 DIAGNOSIS — R1030 Lower abdominal pain, unspecified: Secondary | ICD-10-CM | POA: Diagnosis present

## 2024-02-18 LAB — CBC
HCT: 53.3 % — ABNORMAL HIGH (ref 36.0–46.0)
Hemoglobin: 17.2 g/dL — ABNORMAL HIGH (ref 12.0–15.0)
MCH: 31.4 pg (ref 26.0–34.0)
MCHC: 32.3 g/dL (ref 30.0–36.0)
MCV: 97.3 fL (ref 80.0–100.0)
Platelets: 172 K/uL (ref 150–400)
RBC: 5.48 MIL/uL — ABNORMAL HIGH (ref 3.87–5.11)
RDW: 20.5 % — ABNORMAL HIGH (ref 11.5–15.5)
WBC: 5.7 K/uL (ref 4.0–10.5)
nRBC: 0 % (ref 0.0–0.2)

## 2024-02-18 LAB — URINALYSIS, ROUTINE W REFLEX MICROSCOPIC
Glucose, UA: NEGATIVE mg/dL
Ketones, ur: 20 mg/dL — AB
Nitrite: NEGATIVE
Protein, ur: 100 mg/dL — AB
Specific Gravity, Urine: 1.02 (ref 1.005–1.030)
pH: 7 (ref 5.0–8.0)

## 2024-02-18 LAB — COMPREHENSIVE METABOLIC PANEL WITH GFR
ALT: 21 U/L (ref 0–44)
AST: 31 U/L (ref 15–41)
Albumin: 3.5 g/dL (ref 3.5–5.0)
Alkaline Phosphatase: 80 U/L (ref 38–126)
Anion gap: 14 (ref 5–15)
BUN: <5 mg/dL — ABNORMAL LOW (ref 6–20)
CO2: 26 mmol/L (ref 22–32)
Calcium: 9 mg/dL (ref 8.9–10.3)
Chloride: 95 mmol/L — ABNORMAL LOW (ref 98–111)
Creatinine, Ser: 0.37 mg/dL — ABNORMAL LOW (ref 0.44–1.00)
GFR, Estimated: >60 mL/min (ref >60)
Glucose, Bld: 103 mg/dL — ABNORMAL HIGH (ref 70–99)
Potassium: 3 mmol/L — ABNORMAL LOW (ref 3.5–5.1)
Sodium: 135 mmol/L (ref 135–145)
Total Bilirubin: 2.1 mg/dL — ABNORMAL HIGH (ref 0.0–1.2)
Total Protein: 8 g/dL (ref 6.5–8.1)

## 2024-02-18 LAB — HCG, SERUM, QUALITATIVE: Preg, Serum: NEGATIVE

## 2024-02-18 LAB — LIPASE, BLOOD: Lipase: 27 U/L (ref 11–51)

## 2024-02-18 MED ORDER — IOHEXOL 300 MG/ML  SOLN
100.0000 mL | Freq: Once | INTRAMUSCULAR | Status: AC | PRN
Start: 2024-02-18 — End: 2024-02-19
  Administered 2024-02-19: 100 mL via INTRAVENOUS

## 2024-02-18 MED ORDER — ONDANSETRON 4 MG PO TBDP
4.0000 mg | ORAL_TABLET | Freq: Once | ORAL | Status: DC | PRN
  Filled 2024-02-18: qty 1

## 2024-02-18 MED ORDER — SODIUM CHLORIDE 0.9 % IV BOLUS
1000.0000 mL | Freq: Once | INTRAVENOUS | Status: AC
  Administered 2024-02-18: 1000 mL via INTRAVENOUS

## 2024-02-18 MED ORDER — ONDANSETRON HCL 4 MG/2ML IJ SOLN
4.0000 mg | Freq: Once | INTRAMUSCULAR | Status: AC
  Administered 2024-02-18: 4 mg via INTRAVENOUS
  Filled 2024-02-18: qty 2

## 2024-02-18 NOTE — ED Provider Notes (Signed)
 [place] EMERGENCY DEPARTMENT AT [facility] Provider Note   CSN: [id number] Arrival date & time: 02/18/24  2223     Patient presents with: Abdominal Pain and Emesis   Brandi Lambert is a 28 y.o. female presents today for abdominal pain x 3 days.  Patient reports pain in her lower abdomen on both sides.  With associated nausea, vomiting, and diarrhea.  Patient denies shortness of breath, chest pain, fever, chills, hematemesis, dysuria, hematuria, vaginal discharge, or blood in stool.  Patient reports that she [facility] for her most recent positive test results in June.  {Add pertinent medical, surgical, social history, OB history to HPI:32947}  Abdominal Pain Associated symptoms: diarrhea, nausea and vomiting   Emesis Associated symptoms: abdominal pain and diarrhea        Prior to Admission medications   Medication Sig Start Date End Date Taking? Authorizing Provider  acetaminophen  (TYLENOL ) 500 MG tablet Take 500 mg by mouth every 6 (six) hours as needed for moderate pain.    [provider]  dicyclomine  (BENTYL ) 20 MG tablet Take 1 tablet (20 mg total) by mouth 2 (two) times daily. 05/01/23   Henderly, Britni A, PA-C  loperamide  (IMODIUM ) 2 MG capsule Take 1 capsule (2 mg total) by mouth 4 (four) times daily as needed for diarrhea or loose stools. 05/01/23   Henderly, Britni A, PA-C  metroNIDAZOLE  (FLAGYL ) 500 MG tablet Take 1 tablet (500 mg total) by mouth 2 (two) times daily. 12/22/23   Ruthell Lonni FALCON, PA-C  ondansetron  (ZOFRAN -ODT) 4 MG disintegrating tablet Take 1 tablet (4 mg total) by mouth every 8 (eight) hours as needed. 05/01/23   Henderly, Britni A, PA-C  ondansetron  (ZOFRAN -ODT) 8 MG disintegrating tablet Take 1 tablet (8 mg total) by mouth every 8 (eight) hours as needed for nausea or vomiting. 09/12/22   Steinl, Kevin, MD  potassium chloride  SA (KLOR-CON  M) 20 MEQ tablet Take 1 tablet (20 mEq total) by mouth 2 (two)  times daily for 4 days. 05/01/23 05/05/23  Henderly, Britni A, PA-C  traMADol  (ULTRAM ) 50 MG tablet Take 1 tablet (50 mg total) by mouth every 6 (six) hours as needed. 09/12/22   Steinl, Kevin, MD    Allergies: Patient has no known allergies.    Review of Systems  Gastrointestinal:  Positive for abdominal pain, diarrhea, nausea and vomiting.    Updated Vital Signs BP (!) 136/100 (BP Location: Right Arm)   Pulse (!) 101   Temp 98.2 F (36.8 C) (Oral)   Resp 18   Ht 6' (1.829 m)   Wt 70.8 kg   LMP 12/02/2023 (Approximate)   SpO2 97%   BMI 21.16 kg/m   Physical Exam Vitals and nursing note reviewed.  Constitutional:      General: She is not in acute distress.    Appearance: She is well-developed. She is not ill-appearing.  HENT:     Head: Normocephalic and atraumatic.     Mouth/Throat:     Mouth: Mucous membranes are moist.  Eyes:     Extraocular Movements: Extraocular movements intact.     Conjunctiva/sclera: Conjunctivae normal.  Cardiovascular:     Rate and Rhythm: Normal rate and regular rhythm.     Heart sounds: Normal heart sounds. No murmur heard.    Comments: Borderline tachycardic Pulmonary:     Effort: Pulmonary effort is normal. No respiratory distress.     Breath sounds: Normal breath sounds.  Abdominal:  General: There is no distension.     Palpations: Abdomen is soft.     Tenderness: There is abdominal tenderness in the right lower quadrant, suprapubic area and left lower quadrant. Negative signs include Murphy's sign, Rovsing's sign and McBurney's sign.  Musculoskeletal:        General: No swelling.     Cervical back: Neck supple.  Skin:    General: Skin is warm and dry.     Capillary Refill: Capillary refill takes less than 2 seconds.  Neurological:     General: No focal deficit present.     Mental Status: She is alert and oriented to person, place, and time.  Psychiatric:        Mood and Affect: Mood normal.     (all labs ordered are listed,  but only abnormal results are displayed) Labs Reviewed  CBC - Abnormal; Notable for the following components:      Result Value   RBC 5.48 (*)    Hemoglobin 17.2 (*)    HCT 53.3 (*)    RDW 20.5 (*)    All other components within normal limits  LIPASE, BLOOD  COMPREHENSIVE METABOLIC PANEL WITH GFR  URINALYSIS, ROUTINE W REFLEX MICROSCOPIC  HCG, SERUM, QUALITATIVE    EKG: None  Radiology: No results found.  {Document cardiac monitor, telemetry assessment procedure when appropriate:32947} Procedures   Medications Ordered in the ED  ondansetron  (ZOFRAN ) injection 4 mg (4 mg Intravenous Given 02/18/24 2240)      {Click here for ABCD2, HEART and other calculators REFRESH Note before signing:1}                              Medical Decision Making Amount and/or Complexity of Data Reviewed Labs: ordered.  Risk Prescription drug management.   This patient presents to the ED for concern of abdominal pain with nausea, vomiting, and diarrhea.  Differential diagnosis includes viral GI illness, pancreatitis, appendicitis, choledocholithiasis, acute cholecystitis, biliary colic, diverticulitis, UTI    Additional history obtained Additional history obtained from Electronic Medical Record External records from outside source obtained and reviewed including recent lab results   Lab Tests:  I Ordered, and personally interpreted labs.  The pertinent results include: Elevated hemoglobin at 17.2 likely in the setting of hemoconcentration   Imaging Studies ordered:  I ordered imaging studies including CT abdomen pelvis with contrast I independently visualized and interpreted imaging which showed *** I agree with the radiologist interpretation   Medicines ordered and prescription drug management:  I ordered medication including Zofran  and IVF    I have reviewed the patients home medicines and have made adjustments as needed   Problem List / ED Course:  ***  {Document  critical care time when appropriate  Document review of labs and clinical decision tools ie CHADS2VASC2, etc  Document your independent review of radiology images and any outside records  Document your discussion with family members, caretakers and with consultants  Document social determinants of health affecting pt's care  Document your decision making why or why not admission, treatments were needed:32947:::1}   Final diagnoses:  None    ED Discharge Orders     None

## 2024-02-18 NOTE — ED Provider Notes (Incomplete)
 [place] EMERGENCY DEPARTMENT AT [facility] Provider Note   CSN: [id number] Arrival date & time: 02/18/24  2223     Patient presents with: Abdominal Pain and Emesis   Brandi Lambert is a 28 y.o. female presents today for abdominal pain x 3 days.  Patient reports pain in her lower abdomen on both sides.  With associated nausea, vomiting, and diarrhea.  Patient denies shortness of breath, chest pain, fever, chills, hematemesis, dysuria, hematuria, vaginal discharge, or blood in stool.  Patient reports that she was treated at the health department for her most recent positive test results in June.  {Add pertinent medical, surgical, social history, OB history to HPI:32947}  Abdominal Pain Associated symptoms: diarrhea, nausea and vomiting   Emesis Associated symptoms: abdominal pain and diarrhea        Prior to Admission medications   Medication Sig Start Date End Date Taking? Authorizing Provider  acetaminophen  (TYLENOL ) 500 MG tablet Take 500 mg by mouth every 6 (six) hours as needed for moderate pain.    [provider]  dicyclomine  (BENTYL ) 20 MG tablet Take 1 tablet (20 mg total) by mouth 2 (two) times daily. 05/01/23   Henderly, Britni A, PA-C  loperamide  (IMODIUM ) 2 MG capsule Take 1 capsule (2 mg total) by mouth 4 (four) times daily as needed for diarrhea or loose stools. 05/01/23   Henderly, Britni A, PA-C  metroNIDAZOLE  (FLAGYL ) 500 MG tablet Take 1 tablet (500 mg total) by mouth 2 (two) times daily. 12/22/23   Ruthell Lonni FALCON, PA-C  ondansetron  (ZOFRAN -ODT) 4 MG disintegrating tablet Take 1 tablet (4 mg total) by mouth every 8 (eight) hours as needed. 05/01/23   Henderly, Britni A, PA-C  ondansetron  (ZOFRAN -ODT) 8 MG disintegrating tablet Take 1 tablet (8 mg total) by mouth every 8 (eight) hours as needed for nausea or vomiting. 09/12/22   Steinl, Kevin, MD  potassium chloride  SA (KLOR-CON  M) 20 MEQ tablet Take 1 tablet (20 mEq total) by mouth 2 (two)  times daily for 4 days. 05/01/23 05/05/23  Henderly, Britni A, PA-C  traMADol  (ULTRAM ) 50 MG tablet Take 1 tablet (50 mg total) by mouth every 6 (six) hours as needed. 09/12/22   Steinl, Kevin, MD    Allergies: Patient has no known allergies.    Review of Systems  Gastrointestinal:  Positive for abdominal pain, diarrhea, nausea and vomiting.    Updated Vital Signs BP (!) 136/100 (BP Location: Right Arm)   Pulse (!) 101   Temp 98.2 F (36.8 C) (Oral)   Resp 18   Ht 6' (1.829 m)   Wt 70.8 kg   LMP 12/02/2023 (Approximate)   SpO2 97%   BMI 21.16 kg/m   Physical Exam Vitals and nursing note reviewed.  Constitutional:      General: She is not in acute distress.    Appearance: She is well-developed. She is not ill-appearing.  HENT:     Head: Normocephalic and atraumatic.     Mouth/Throat:     Mouth: Mucous membranes are moist.  Eyes:     Extraocular Movements: Extraocular movements intact.     Conjunctiva/sclera: Conjunctivae normal.  Cardiovascular:     Rate and Rhythm: Normal rate and regular rhythm.     Heart sounds: Normal heart sounds. No murmur heard.    Comments: Borderline tachycardic Pulmonary:     Effort: Pulmonary effort is normal. No respiratory distress.     Breath sounds: Normal breath sounds.  Abdominal:  General: There is no distension.     Palpations: Abdomen is soft.     Tenderness: There is abdominal tenderness in the right lower quadrant, suprapubic area and left lower quadrant. Negative signs include Murphy's sign, Rovsing's sign and McBurney's sign.  Musculoskeletal:        General: No swelling.     Cervical back: Neck supple.  Skin:    General: Skin is warm and dry.     Capillary Refill: Capillary refill takes less than 2 seconds.  Neurological:     General: No focal deficit present.     Mental Status: She is alert and oriented to person, place, and time.  Psychiatric:        Mood and Affect: Mood normal.     (all labs ordered are listed,  but only abnormal results are displayed) Labs Reviewed  CBC - Abnormal; Notable for the following components:      Result Value   RBC 5.48 (*)    Hemoglobin 17.2 (*)    HCT 53.3 (*)    RDW 20.5 (*)    All other components within normal limits  LIPASE, BLOOD  COMPREHENSIVE METABOLIC PANEL WITH GFR  URINALYSIS, ROUTINE W REFLEX MICROSCOPIC  HCG, SERUM, QUALITATIVE    EKG: None  Radiology: No results found.  {Document cardiac monitor, telemetry assessment procedure when appropriate:32947} Procedures   Medications Ordered in the ED  ondansetron  (ZOFRAN ) injection 4 mg (4 mg Intravenous Given 02/18/24 2240)      {Click here for ABCD2, HEART and other calculators REFRESH Note before signing:1}                              Medical Decision Making Amount and/or Complexity of Data Reviewed Labs: ordered. Radiology: ordered.  Risk Prescription drug management.   This patient presents to the ED for concern of abdominal pain with nausea, vomiting, and diarrhea.  Differential diagnosis includes viral GI illness, pancreatitis, appendicitis, choledocholithiasis, acute cholecystitis, biliary colic, diverticulitis, UTI    Additional history obtained Additional history obtained from Electronic Medical Record External records from outside source obtained and reviewed including recent lab results   Lab Tests:  I Ordered, and personally interpreted labs.  The pertinent results include: Elevated hemoglobin at 17.2 likely in the setting of hemoconcentration, hypokalemia at 3.0, mildly reduced chloride at 95, bun less than 5, low creatinine at 0.37, elevated total bili at 2.1, UA with small bilirubin, moderate hemoglobin, 20 ketones, small leukocytes, 100 protein, many bacteria, 21-50 RBCs, 11-20 squamous epithelials, 21-50 WBCs   Imaging Studies ordered:  I ordered imaging studies including CT abdomen pelvis with contrast I independently visualized and interpreted imaging which  showed *** I agree with the radiologist interpretation   Medicines ordered and prescription drug management:  I ordered medication including Zofran  and IVF    I have reviewed the patients home medicines and have made adjustments as needed   Problem List / ED Course:  ***  {Document critical care time when appropriate  Document review of labs and clinical decision tools ie CHADS2VASC2, etc  Document your independent review of radiology images and any outside records  Document your discussion with family members, caretakers and with consultants  Document social determinants of health affecting pt's care  Document your decision making why or why not admission, treatments were needed:32947:::1}   Final diagnoses:  None    ED Discharge Orders     None

## 2024-02-18 NOTE — ED Triage Notes (Signed)
 Pt reports abd pain x 3 days along w/ n/v/d. Reports it is her lower abdomen bilateral.

## 2024-02-19 MED ORDER — ONDANSETRON 4 MG PO TBDP
4.0000 mg | ORAL_TABLET | Freq: Three times a day (TID) | ORAL | 0 refills | Status: AC | PRN
Start: 2024-02-19 — End: ?

## 2024-02-19 MED ORDER — SODIUM CHLORIDE 0.9 % IV SOLN
1.0000 g | Freq: Once | INTRAVENOUS | Status: AC
  Administered 2024-02-19: 1 g via INTRAVENOUS
  Filled 2024-02-19: qty 10

## 2024-02-19 MED ORDER — CEPHALEXIN 500 MG PO CAPS: 500.0000 mg | ORAL_CAPSULE | Freq: Two times a day (BID) | ORAL | 0 refills | Status: AC

## 2024-02-19 MED ORDER — POTASSIUM CHLORIDE CRYS ER 20 MEQ PO TBCR: 20.0000 meq | EXTENDED_RELEASE_TABLET | Freq: Two times a day (BID) | ORAL | 0 refills | Status: AC

## 2024-02-19 MED ORDER — POTASSIUM CHLORIDE CRYS ER 20 MEQ PO TBCR
40.0000 meq | EXTENDED_RELEASE_TABLET | Freq: Once | ORAL | Status: AC
  Administered 2024-02-19: 40 meq via ORAL
  Filled 2024-02-19: qty 2

## 2024-02-19 NOTE — Discharge Instructions (Addendum)
 Today you were seen for colitis and a urinary tract infection.  You are also found to have low potassium while in the emergency department.  Please pick up your medications and take as prescribed.  Thank you for letting us  treat you today. After reviewing your labs and imaging, I feel you are safe to go home. Please follow up with your PCP in the next several days and provide them with your records from this visit. Return to the Emergency Room if pain becomes severe or symptoms worsen.

## 2024-06-02 ENCOUNTER — Other Ambulatory Visit: Payer: Self-pay

## 2024-06-02 ENCOUNTER — Emergency Department (HOSPITAL_COMMUNITY)
Admission: EM | Admit: 2024-06-02 | Discharge: 2024-06-02 | Disposition: A | Discharge status: Home / Self Care | Attending: Emergency Medicine | Admitting: Emergency Medicine

## 2024-06-02 DIAGNOSIS — R21 Rash and other nonspecific skin eruption: Secondary | ICD-10-CM | POA: Insufficient documentation

## 2024-06-02 DIAGNOSIS — Z8619 Personal history of other infectious and parasitic diseases: Secondary | ICD-10-CM | POA: Insufficient documentation

## 2024-06-02 DIAGNOSIS — N3000 Acute cystitis without hematuria: Secondary | ICD-10-CM | POA: Insufficient documentation

## 2024-06-02 DIAGNOSIS — R1024 Suprapubic pain: Secondary | ICD-10-CM

## 2024-06-02 LAB — COMPREHENSIVE METABOLIC PANEL WITH GFR
ALT: 29 U/L (ref 0–44)
AST: 40 U/L (ref 15–41)
Albumin: 3.9 g/dL (ref 3.5–5.0)
Alkaline Phosphatase: 68 U/L (ref 38–126)
Anion gap: 10 (ref 5–15)
BUN: <5 mg/dL — ABNORMAL LOW (ref 6–20)
CO2: 28 mmol/L (ref 22–32)
Calcium: 9.2 mg/dL (ref 8.9–10.3)
Chloride: 101 mmol/L (ref 98–111)
Creatinine, Ser: 0.69 mg/dL (ref 0.44–1.00)
GFR, Estimated: >60 mL/min (ref >60)
Glucose, Bld: 103 mg/dL — ABNORMAL HIGH (ref 70–99)
Potassium: 3.2 mmol/L — ABNORMAL LOW (ref 3.5–5.1)
Sodium: 138 mmol/L (ref 135–145)
Total Bilirubin: 1.2 mg/dL (ref 0.0–1.2)
Total Protein: 7.2 g/dL (ref 6.5–8.1)

## 2024-06-02 LAB — URINALYSIS, ROUTINE W REFLEX MICROSCOPIC
Bilirubin Urine: NEGATIVE
Glucose, UA: NEGATIVE mg/dL
Ketones, ur: NEGATIVE mg/dL
Nitrite: POSITIVE — AB
Protein, ur: 30 mg/dL — AB
Specific Gravity, Urine: 1.016 (ref 1.005–1.030)
WBC, UA: >50 WBC/hpf (ref 0–5)
pH: 5 (ref 5.0–8.0)

## 2024-06-02 LAB — WET PREP, GENITAL
Sperm: NONE SEEN
Trich, Wet Prep: NONE SEEN
WBC, Wet Prep HPF POC: <10 (ref <10)
Yeast Wet Prep HPF POC: NONE SEEN

## 2024-06-02 LAB — CBC
HCT: 50.6 % — ABNORMAL HIGH (ref 36.0–46.0)
Hemoglobin: 17.2 g/dL — ABNORMAL HIGH (ref 12.0–15.0)
MCH: 33.9 pg (ref 26.0–34.0)
MCHC: 34 g/dL (ref 30.0–36.0)
MCV: 99.6 fL (ref 80.0–100.0)
Platelets: 180 K/uL (ref 150–400)
RBC: 5.08 MIL/uL (ref 3.87–5.11)
RDW: 20.2 % — ABNORMAL HIGH (ref 11.5–15.5)
WBC: 8.7 K/uL (ref 4.0–10.5)
nRBC: 0 % (ref 0.0–0.2)

## 2024-06-02 LAB — MAGNESIUM: Magnesium: 2 mg/dL (ref 1.7–2.4)

## 2024-06-02 LAB — TSH: TSH: 1.27 u[IU]/mL (ref 0.350–4.500)

## 2024-06-02 LAB — HCG, SERUM, QUALITATIVE: Preg, Serum: NEGATIVE

## 2024-06-02 LAB — LIPASE, BLOOD: Lipase: 19 U/L (ref 11–51)

## 2024-06-02 MED ORDER — DOXYCYCLINE HYCLATE 100 MG PO TABS
100.0000 mg | ORAL_TABLET | Freq: Once | ORAL | Status: AC
  Administered 2024-06-02: 100 mg via ORAL
  Filled 2024-06-02: qty 1

## 2024-06-02 MED ORDER — IBUPROFEN 800 MG PO TABS: 800.0000 mg | ORAL_TABLET | Freq: Three times a day (TID) | ORAL | 0 refills | Status: AC

## 2024-06-02 MED ORDER — SODIUM CHLORIDE 0.9 % IV SOLN
1.0000 g | Freq: Once | INTRAVENOUS | Status: AC
  Administered 2024-06-02: 1 g via INTRAVENOUS
  Filled 2024-06-02: qty 10

## 2024-06-02 MED ORDER — IBUPROFEN 800 MG PO TABS
800.0000 mg | ORAL_TABLET | Freq: Once | ORAL | Status: AC
  Administered 2024-06-02: 800 mg via ORAL
  Filled 2024-06-02: qty 1

## 2024-06-02 MED ORDER — DOXYCYCLINE HYCLATE 100 MG PO CAPS: 100.0000 mg | ORAL_CAPSULE | Freq: Two times a day (BID) | ORAL | 0 refills | Status: AC

## 2024-06-02 MED ORDER — CEPHALEXIN 500 MG PO CAPS: 500.0000 mg | ORAL_CAPSULE | Freq: Four times a day (QID) | ORAL | 0 refills | Status: AC

## 2024-06-02 MED ORDER — CEFTRIAXONE SODIUM 250 MG IJ SOLR: 250.0000 mg | Freq: Once | INTRAMUSCULAR | Status: DC

## 2024-06-02 NOTE — ED Triage Notes (Signed)
 Patient brought in by POV with c/o lower abdominal pain, back pain bilateral feet pain and states I think its my colon again. No abdominal history noted.

## 2024-06-02 NOTE — ED Provider Notes (Signed)
 [place] EMERGENCY DEPARTMENT AT [facility] Provider Note   CSN: [id number] Arrival date & time: 06/02/24  8057     Patient presents with: Abdominal Pain   Brandi Lambert is a 28 y.o. female.   HPI Patient reports she has been having lower abdominal pain and low back pain for couple of days.  Patient reports she thinks it might be colitis again.  She reports she had this previously.  Patient reports she had 1 episode of vomiting while ago but no continuous vomiting.  Has had a couple episodes of diarrheal stool.  Patient reports that she is sexually active.  She does not have high suspicion for sexually transmitted infection although she reports that is possible.  She was diagnosed with syphilis and treated.  She reports she thinks she had a follow-up negative test sometime in August.  Patient also states that she has a rash that she has gotten over multiple parts of her body.  She reports this on her legs and also on her arms.  She is not sure where it is coming from.  At first she thought was infection around hair follicles but has spread to include her arms and upper legs as well.    Prior to Admission medications   Medication Sig Start Date End Date Taking? Authorizing Provider  cephALEXin  (KEFLEX ) 500 MG capsule Take 1 capsule (500 mg total) by mouth 4 (four) times daily. 06/02/24  Yes Armenta Canning, MD  doxycycline  (VIBRAMYCIN ) 100 MG capsule Take 1 capsule (100 mg total) by mouth 2 (two) times daily. 06/02/24  Yes Armenta Canning, MD  ibuprofen  (ADVIL ) 800 MG tablet Take 1 tablet (800 mg total) by mouth 3 (three) times daily. 06/02/24  Yes Armenta Canning, MD  acetaminophen  (TYLENOL ) 500 MG tablet Take 500 mg by mouth every 6 (six) hours as needed for moderate pain.    [provider]  cephALEXin  (KEFLEX ) 500 MG capsule Take 1 capsule (500 mg total) by mouth 2 (two) times daily. 02/19/24   Keith, Kayla N, PA-C  dicyclomine  (BENTYL ) 20 MG tablet Take 1 tablet (20  mg total) by mouth 2 (two) times daily. 05/01/23   Henderly, Britni A, PA-C  loperamide  (IMODIUM ) 2 MG capsule Take 1 capsule (2 mg total) by mouth 4 (four) times daily as needed for diarrhea or loose stools. 05/01/23   Henderly, Britni A, PA-C  metroNIDAZOLE  (FLAGYL ) 500 MG tablet Take 1 tablet (500 mg total) by mouth 2 (two) times daily. 12/22/23   Ruthell Lonni FALCON, PA-C  ondansetron  (ZOFRAN -ODT) 4 MG disintegrating tablet Take 1 tablet (4 mg total) by mouth every 8 (eight) hours as needed. 05/01/23   Henderly, Britni A, PA-C  ondansetron  (ZOFRAN -ODT) 4 MG disintegrating tablet Take 1 tablet (4 mg total) by mouth every 8 (eight) hours as needed for nausea or vomiting. 02/19/24   Keith, Kayla N, PA-C  ondansetron  (ZOFRAN -ODT) 8 MG disintegrating tablet Take 1 tablet (8 mg total) by mouth every 8 (eight) hours as needed for nausea or vomiting. 09/12/22   Steinl, Kevin, MD  potassium chloride  SA (KLOR-CON  M) 20 MEQ tablet Take 1 tablet (20 mEq total) by mouth 2 (two) times daily for 4 days. 05/01/23 05/05/23  Henderly, Britni A, PA-C  potassium chloride  SA (KLOR-CON  M) 20 MEQ tablet Take 1 tablet (20 mEq total) by mouth 2 (two) times daily. 02/19/24   Keith, Kayla N, PA-C  traMADol  (ULTRAM ) 50 MG tablet Take 1 tablet (50 mg total) by mouth every  6 (six) hours as needed. 09/12/22   Steinl, Kevin, MD    Allergies: Patient has no known allergies.    Review of Systems  Updated Vital Signs BP (!) 113/94 (BP Location: Left Arm) Comment: Simultaneous filing. User may not have seen previous data.  Pulse (!) 109 Comment: Simultaneous filing. User may not have seen previous data.  Temp 98.1 F (36.7 C)   Resp 16 Comment: Simultaneous filing. User may not have seen previous data.  Ht 6' (1.829 m)   Wt 68 kg   SpO2 97%   BMI 20.34 kg/m   Physical Exam Constitutional:      Comments: Alert nontoxic clear mental status.  No acute distress.  HENT:     Mouth/Throat:     Pharynx: Oropharynx is clear.  Eyes:      Extraocular Movements: Extraocular movements intact.  Cardiovascular:     Rate and Rhythm: Normal rate.  Pulmonary:     Effort: Pulmonary effort is normal.     Breath sounds: Normal breath sounds.  Abdominal:     Comments: Abdomen soft without guarding.  Mildly reproducible suprapubic pain to palpation.  Genitourinary:    Comments: Normal external female genitalia.  Speculum exam moderate whitish discharge in the vault.  No drainage from the cervix.  Bimanual exam moderate diffuse tenderness of the uterus.  No exquisite tenderness with movement of the cervix.  No mass or fullness in the adnexa. Musculoskeletal:        General: Normal range of motion.     Comments: Feet are warm and dry.  No peripheral edema.  2+ peripheral pulses.  Skin:    General: Skin is warm and dry.     Findings: Rash present.     Comments: Patient has a papular rash over her legs and arms.  See attached images.  This does not involve the face.  Neurological:     General: No focal deficit present.     Mental Status: She is oriented to person, place, and time.     Motor: No weakness.     Coordination: Coordination normal.  Psychiatric:        Mood and Affect: Mood normal.        (all labs ordered are listed, but only abnormal results are displayed) Labs Reviewed  WET PREP, GENITAL - Abnormal; Notable for the following components:      Result Value   Clue Cells Wet Prep HPF POC PRESENT (*)    All other components within normal limits  COMPREHENSIVE METABOLIC PANEL WITH GFR - Abnormal; Notable for the following components:   Potassium 3.2 (*)    Glucose, Bld 103 (*)    BUN <5 (*)    All other components within normal limits  CBC - Abnormal; Notable for the following components:   Hemoglobin 17.2 (*)    HCT 50.6 (*)    RDW 20.2 (*)    All other components within normal limits  URINALYSIS, ROUTINE W REFLEX MICROSCOPIC - Abnormal; Notable for the following components:   Color, Urine AMBER (*)     APPearance HAZY (*)    Hgb urine dipstick SMALL (*)    Protein, ur 30 (*)    Nitrite POSITIVE (*)    Leukocytes,Ua MODERATE (*)    Bacteria, UA RARE (*)    All other components within normal limits  URINE CULTURE  LIPASE, BLOOD  HCG, SERUM, QUALITATIVE  MAGNESIUM  TSH  SYPHILIS: RPR W/REFLEX TO RPR TITER AND TREPONEMAL ANTIBODIES,  TRADITIONAL SCREENING AND DIAGNOSIS ALGORITHM  HIV ANTIBODY (ROUTINE TESTING W REFLEX)  GC/CHLAMYDIA PROBE AMP ([place]) NOT AT [facility]    EKG: None  Radiology: No results found.   Procedures   Medications Ordered in the ED  cefTRIAXone  (ROCEPHIN ) injection 250 mg (has no administration in time range)  doxycycline  (VIBRA -TABS) tablet 100 mg (has no administration in time range)  ibuprofen  (ADVIL ) tablet 800 mg (has no administration in time range)                                    Medical Decision Making Amount and/or Complexity of Data Reviewed Labs: ordered.  Risk Prescription drug management.   Patient presents as outlined.  Abdominal examination is nonsurgical.  Tenderness is concentrated to the suprapubic area.  She does have CT documented colitis previously although at this time her exam is less suggestive of colitis with more focal suprapubic tenderness.  Urinalysis is grossly positive with positive nitrite and leuk esterase with greater than 50 WBCs with good collection.  At this time I have higher suspicion for UTI with lower abdominal pain and low back pain.  Also patient is sexually active and previously has syphilis.  She has a diffuse body rash as imaged.  Pelvic exam performed.  She has tenderness over the uterus but not exquisite cervical motion tenderness or adnexal tenderness.  At this time I feel patient will benefit from treatment for syphilis until repeat testing is returned and also empiric treatment for GC chlamydia.  Patient will also be treated for UTI with Keflex .  At this time she is nontoxic and well in appearance.   Stable for discharge.  We reviewed the importance of follow-up with her PCP to make referral to GI to further investigate to CT positive episodes of colitis.  Patient does not have any known history of ulcerative colitis or Crohn's and no family history.  Today she has no fever no leukocytosis.  I do not think that repeat CT imaging is needed at this time.  Follow-up plan and return precautions reviewed.     Final diagnoses:  Acute cystitis without hematuria  Rash  H/O syphilis  Suprapubic pain    ED Discharge Orders          Ordered    doxycycline  (VIBRAMYCIN ) 100 MG capsule  2 times daily        06/02/24 2232    cephALEXin  (KEFLEX ) 500 MG capsule  4 times daily        06/02/24 2232    ibuprofen  (ADVIL ) 800 MG tablet  3 times daily        06/02/24 2232               Armenta Canning, MD 06/02/24 2244

## 2024-06-02 NOTE — ED Triage Notes (Signed)
 Patient c/o Nausea and diarrhea.

## 2024-06-02 NOTE — Discharge Instructions (Addendum)
 1.  Your urinalysis test positive for infection.  You are being treated with an antibiotic called Keflex .  Take this as prescribed. 2.  You previously had syphilis.  You reported you were treated for it.  However, at this time you have a rash all of your body and syphilis is a disease that could cause a similar rash.  You are being treated again for syphilis and you have been tested but the test results will not be available today.  Your treatment is with doxycycline  for 14 days.  You must start this medication tomorrow and continue it as prescribed.  If you have untreated or undertreated syphilis you can have severe complications.  Review information regarding syphilis. 3.  At this time I think your abdominal pain is due to urinary tract infection or pelvic infection.  However, in the past you have had 2 episodes of colitis that was identified on CT scan.  You should have a follow-up with a gastroenterologist to see if you need a colonoscopy to further assess the type of colitis this might be.  Some people have chronic diseases called Crohn's disease or ulcerative colitis.  Discussed referral to a gastroenterologist with your primary care doctor.  Is very important that you follow-up with your primary care doctor to get help coordinating your health care.

## 2024-06-03 LAB — HIV ANTIBODY (ROUTINE TESTING W REFLEX): HIV Screen 4th Generation wRfx: NONREACTIVE

## 2024-06-03 LAB — SYPHILIS: RPR W/REFLEX TO RPR TITER AND TREPONEMAL ANTIBODIES, TRADITIONAL SCREENING AND DIAGNOSIS ALGORITHM
RPR Ser Ql: REACTIVE — AB
RPR Titer: 1:1 {titer}

## 2024-06-04 LAB — T.PALLIDUM AB, TOTAL: T Pallidum Abs: REACTIVE — AB

## 2024-06-05 LAB — GC/CHLAMYDIA PROBE AMP (~~LOC~~) NOT AT ARMC
Chlamydia: NEGATIVE
Comment: NEGATIVE
Comment: NORMAL
Neisseria Gonorrhea: NEGATIVE

## 2024-06-05 LAB — URINE CULTURE: Culture: >=100000 — AB

## 2024-06-06 ENCOUNTER — Telehealth (HOSPITAL_BASED_OUTPATIENT_CLINIC_OR_DEPARTMENT_OTHER): Payer: Self-pay | Admitting: *Deleted

## 2024-06-06 NOTE — Telephone Encounter (Signed)
 Post ED Visit - Positive Culture Follow-up  Culture report reviewed by antimicrobial stewardship pharmacist: [facility] []  Rankin Dee, Pharm.D. []  Venetia Gully, Pharm.D., BCPS AQ-ID []  Garrel Crews, Pharm.D., BCPS []  Almarie Lunger, Pharm.D., BCPS []  [place], [address].D., BCPS, AAHIVP []  Rosaline Bihari, Pharm.D., BCPS, AAHIVP []  Vernell Meier, PharmD, BCPS []  Latanya Hint, PharmD, BCPS []  Donald Medley, PharmD, BCPS []  Rocky Bold, PharmD []  Dorothyann Alert, PharmD, BCPS []  Morene Babe, PharmD  [facility] [x]  Damien Quiet, PharmD []  Romona Bliss, PharmD []  Dolphus Roller, PharmD []  Veva Seip, Rph []  Vernell Daunt) Leonce, PharmD []  Eva Allis, PharmD []  Rosaline Millet, PharmD []  Iantha Batch, PharmD []  Arvin Gauss, PharmD []  Wanda Hasting, PharmD []  Ronal Rav, PharmD []  Rocky Slade, PharmD []  Bard Jeans, PharmD   Positive urine culture Treated with Cephalexin , organism sensitive to the same and no further patient follow-up is required at this time.  Jama Wyman Kipper 06/06/2024, 10:09 AM
